# Patient Record
Sex: Male | Born: 1975 | Hispanic: Yes | Marital: Married | Smoking: Never smoker
Health system: Southern US, Community
[De-identification: ages and names within clinical notes are randomized; demographics above are authoritative.]

## PROBLEM LIST (undated history)

## (undated) DIAGNOSIS — E7800 Pure hypercholesterolemia, unspecified: Secondary | ICD-10-CM

## (undated) DIAGNOSIS — J45909 Unspecified asthma, uncomplicated: Secondary | ICD-10-CM

## (undated) DIAGNOSIS — T7840XA Allergy, unspecified, initial encounter: Secondary | ICD-10-CM

## (undated) HISTORY — DX: Unspecified asthma, uncomplicated: J45.909

## (undated) HISTORY — DX: Allergy, unspecified, initial encounter: T78.40XA

---

## 2018-04-29 DIAGNOSIS — J309 Allergic rhinitis, unspecified: Secondary | ICD-10-CM | POA: Diagnosis not present

## 2018-04-29 DIAGNOSIS — E663 Overweight: Secondary | ICD-10-CM | POA: Diagnosis not present

## 2018-04-29 DIAGNOSIS — E782 Mixed hyperlipidemia: Secondary | ICD-10-CM | POA: Diagnosis not present

## 2020-10-14 ENCOUNTER — Encounter (HOSPITAL_BASED_OUTPATIENT_CLINIC_OR_DEPARTMENT_OTHER): Payer: Self-pay | Admitting: Obstetrics and Gynecology

## 2020-10-14 ENCOUNTER — Emergency Department (HOSPITAL_BASED_OUTPATIENT_CLINIC_OR_DEPARTMENT_OTHER)
Admission: EM | Admit: 2020-10-14 | Discharge: 2020-10-14 | Disposition: A | Discharge status: Home / Self Care | Payer: Commercial Managed Care - PPO | Attending: Emergency Medicine | Admitting: Emergency Medicine

## 2020-10-14 ENCOUNTER — Emergency Department (HOSPITAL_BASED_OUTPATIENT_CLINIC_OR_DEPARTMENT_OTHER): Payer: Commercial Managed Care - PPO

## 2020-10-14 ENCOUNTER — Other Ambulatory Visit: Payer: Self-pay

## 2020-10-14 DIAGNOSIS — D72829 Elevated white blood cell count, unspecified: Secondary | ICD-10-CM | POA: Insufficient documentation

## 2020-10-14 DIAGNOSIS — K529 Noninfective gastroenteritis and colitis, unspecified: Secondary | ICD-10-CM | POA: Diagnosis not present

## 2020-10-14 DIAGNOSIS — R197 Diarrhea, unspecified: Secondary | ICD-10-CM | POA: Diagnosis present

## 2020-10-14 HISTORY — DX: Pure hypercholesterolemia, unspecified: E78.00

## 2020-10-14 LAB — CBC
HCT: 51.5 % (ref 39.0–52.0)
Hemoglobin: 17.4 g/dL — ABNORMAL HIGH (ref 13.0–17.0)
MCH: 29.3 pg (ref 26.0–34.0)
MCHC: 33.8 g/dL (ref 30.0–36.0)
MCV: 86.8 fL (ref 80.0–100.0)
Platelets: 268 10*3/uL (ref 150–400)
RBC: 5.93 MIL/uL — ABNORMAL HIGH (ref 4.22–5.81)
RDW: 12 % (ref 11.5–15.5)
WBC: 17.1 10*3/uL — ABNORMAL HIGH (ref 4.0–10.5)
nRBC: 0 % (ref 0.0–0.2)

## 2020-10-14 LAB — URINALYSIS, ROUTINE W REFLEX MICROSCOPIC
Bilirubin Urine: NEGATIVE
Glucose, UA: NEGATIVE mg/dL
Hgb urine dipstick: NEGATIVE
Ketones, ur: NEGATIVE mg/dL
Leukocytes,Ua: NEGATIVE
Nitrite: NEGATIVE
Protein, ur: 30 mg/dL — AB
Specific Gravity, Urine: 1.04 — ABNORMAL HIGH (ref 1.005–1.030)
pH: 6 (ref 5.0–8.0)

## 2020-10-14 LAB — LIPASE, BLOOD: Lipase: 11 U/L (ref 11–51)

## 2020-10-14 LAB — COMPREHENSIVE METABOLIC PANEL
ALT: 62 U/L — ABNORMAL HIGH (ref 0–44)
AST: 37 U/L (ref 15–41)
Albumin: 4.6 g/dL (ref 3.5–5.0)
Alkaline Phosphatase: 73 U/L (ref 38–126)
Anion gap: 13 (ref 5–15)
BUN: 30 mg/dL — ABNORMAL HIGH (ref 6–20)
CO2: 20 mmol/L — ABNORMAL LOW (ref 22–32)
Calcium: 9.4 mg/dL (ref 8.9–10.3)
Chloride: 106 mmol/L (ref 98–111)
Creatinine, Ser: 1.15 mg/dL (ref 0.61–1.24)
GFR, Estimated: >60 mL/min (ref >60)
Glucose, Bld: 127 mg/dL — ABNORMAL HIGH (ref 70–99)
Potassium: 3.7 mmol/L (ref 3.5–5.1)
Sodium: 139 mmol/L (ref 135–145)
Total Bilirubin: 0.8 mg/dL (ref 0.3–1.2)
Total Protein: 7.4 g/dL (ref 6.5–8.1)

## 2020-10-14 MED ORDER — LACTATED RINGERS IV BOLUS
1000.0000 mL | Freq: Once | INTRAVENOUS | Status: AC
  Administered 2020-10-14: 1000 mL via INTRAVENOUS

## 2020-10-14 MED ORDER — ONDANSETRON 4 MG PO TBDP
4.0000 mg | ORAL_TABLET | Freq: Once | ORAL | Status: AC | PRN
  Administered 2020-10-14: 4 mg via ORAL
  Filled 2020-10-14: qty 1

## 2020-10-14 MED ORDER — ONDANSETRON 4 MG PO TBDP: 4.0000 mg | ORAL_TABLET | Freq: Three times a day (TID) | ORAL | 0 refills | Status: DC | PRN

## 2020-10-14 MED ORDER — IOHEXOL 350 MG/ML SOLN
75.0000 mL | Freq: Once | INTRAVENOUS | Status: AC | PRN
  Administered 2020-10-14: 75 mL via INTRAVENOUS

## 2020-10-14 NOTE — ED Notes (Signed)
Pt discharged home after verbalizing understanding of discharge instructions; nad noted. 

## 2020-10-14 NOTE — Discharge Instructions (Signed)
You were evaluated in the Emergency Department and after careful evaluation, we did not find any emergent condition requiring admission or further testing in the hospital.  Your exam/testing today was overall reassuring. Symptoms are most consistent with gastroenteritis, which will run its course over the next few days. I will prescribe Zofran for nausea.  Please return to the Emergency Department if you experience any worsening of your condition.  Thank you for allowing [place] to be a part of your care.

## 2020-10-14 NOTE — ED Provider Notes (Signed)
[facility] EMERGENCY DEPT Provider Note   CSN: [id number] Arrival date & time: 10/14/20  1334     History Chief Complaint  Patient presents with   Diarrhea    Howard Alexander is a 45 y.o. male.  The history is provided by the patient.  Diarrhea Quality:  Watery Severity:  Moderate Duration:  3 days Timing:  Intermittent Progression:  Unchanged Associated symptoms: abdominal pain and vomiting   Associated symptoms: no chills, no fever, no headaches and no myalgias   Risk factors: no recent antibiotic use, no sick contacts, no suspicious food intake and no travel to endemic areas    45 year old male with a history of HLD presenting to the emergency department with a diarrheal illness.  The patient states that since Saturday, he has had anorexia, nausea, multiple episodes of watery diarrhea that are nonbloody.  Today he had 1 episode of NBNB emesis as well and has had no oral intake all day.  He denies any recent travel or suspect food intake.  He endorses mild bilateral cramping abdominal pain and discomfort, worse in the left lower quadrant.  He denies any fever or chills.  He denies any flank pain or burning while urinating or hematuria.  He denies any recent antibiotic use.  He states that he recently got over a COVID-19 infection this past month.  Past Medical History:  Diagnosis Date   Hypercholesterolemia     There are no problems to display for this patient.   History reviewed. No pertinent surgical history.     No family history on file.  Social History   Tobacco Use   Smoking status: Never    Passive exposure: Never   Smokeless tobacco: Never  Vaping Use   Vaping Use: Never used  Substance Use Topics   Alcohol use: Yes    Comment: Social   Drug use: Never    Home Medications Prior to Admission medications   Medication Sig Start Date End Date Taking? Authorizing Provider  ondansetron (ZOFRAN ODT) 4 MG disintegrating tablet Take 1 tablet  (4 mg total) by mouth every 8 (eight) hours as needed for nausea or vomiting. 10/14/20  Yes Ernie Avena, MD    Allergies    Patient has no allergy information on record.  Review of Systems   Review of Systems  Constitutional:  Negative for chills and fever.  Gastrointestinal:  Positive for abdominal pain, diarrhea, nausea and vomiting.  Musculoskeletal:  Negative for myalgias.  Neurological:  Negative for headaches.  All other systems reviewed and are negative.  Physical Exam Updated Vital Signs BP 113/85   Pulse 89   Temp 98.7 F (37.1 C) (Oral)   Resp 16   Ht 5\' 10"  (1.778 m)   Wt 112.4 kg   SpO2 98%   BMI 35.57 kg/m   Physical Exam Vitals and nursing note reviewed.  Constitutional:      General: He is not in acute distress. HENT:     Head: Normocephalic and atraumatic.     Mouth/Throat:     Mouth: Mucous membranes are dry.  Eyes:     Conjunctiva/sclera: Conjunctivae normal.     Pupils: Pupils are equal, round, and reactive to light.  Cardiovascular:     Rate and Rhythm: Normal rate and regular rhythm.  Pulmonary:     Effort: Pulmonary effort is normal. No respiratory distress.  Abdominal:     General: There is no distension.     Tenderness: There is abdominal tenderness in the  right lower quadrant and left lower quadrant. There is no guarding or rebound.  Musculoskeletal:        General: No deformity or signs of injury.     Cervical back: Normal range of motion and neck supple.  Skin:    Findings: No lesion or rash.  Neurological:     General: No focal deficit present.     Mental Status: He is alert. Mental status is at baseline.    ED Results / Procedures / Treatments   Labs (all labs ordered are listed, but only abnormal results are displayed) Labs Reviewed  COMPREHENSIVE METABOLIC PANEL - Abnormal; Notable for the following components:      Result Value   CO2 20 (*)    Glucose, Bld 127 (*)    BUN 30 (*)    ALT 62 (*)    All other components  within normal limits  CBC - Abnormal; Notable for the following components:   WBC 17.1 (*)    RBC 5.93 (*)    Hemoglobin 17.4 (*)    All other components within normal limits  URINALYSIS, ROUTINE W REFLEX MICROSCOPIC - Abnormal; Notable for the following components:   APPearance HAZY (*)    Specific Gravity, Urine 1.040 (*)    Protein, ur 30 (*)    All other components within normal limits  GASTROINTESTINAL PANEL BY PCR, STOOL (REPLACES STOOL CULTURE)  LIPASE, BLOOD    EKG None  Radiology CT ABDOMEN PELVIS W CONTRAST  Result Date: 10/14/2020 CLINICAL DATA:  Diarrhea and vomiting since Saturday. EXAM: CT ABDOMEN AND PELVIS WITH CONTRAST TECHNIQUE: Multidetector CT imaging of the abdomen and pelvis was performed using the standard protocol following bolus administration of intravenous contrast. CONTRAST:  50mL OMNIPAQUE IOHEXOL 350 MG/ML SOLN COMPARISON:  None. FINDINGS: Lower chest: No acute abnormality. Hepatobiliary: No focal liver abnormality is seen. No gallstones, gallbladder wall thickening, or biliary dilatation. Pancreas: Unremarkable. No pancreatic ductal dilatation or surrounding inflammatory changes. Spleen: Normal in size without focal abnormality. Adrenals/Urinary Tract: Adrenal glands are unremarkable. Punctate calculus in the lower pole of the left kidney. No hydronephrosis. The bladder is unremarkable for the degree of distention. Stomach/Bowel: Stomach is within normal limits. Appendix appears normal. No evidence of bowel wall thickening, distention, or inflammatory changes. Vascular/Lymphatic: No significant vascular findings are present. No enlarged abdominal or pelvic lymph nodes. Reproductive: Prostate is unremarkable. Other: Tiny fat containing umbilical hernia. No abdominopelvic ascites. No pneumoperitoneum. Musculoskeletal: No acute or significant osseous findings. IMPRESSION: 1. No acute intra-abdominal process. 2. Punctate nonobstructive left nephrolithiasis.  Electronically Signed   By: Obie Dredge M.D.   On: 10/14/2020 18:08    Procedures Procedures   Medications Ordered in ED Medications  ondansetron (ZOFRAN-ODT) disintegrating tablet 4 mg (4 mg Oral Given 10/14/20 1412)  lactated ringers bolus 1,000 mL (1,000 mLs Intravenous New Bag/Given 10/14/20 1708)  iohexol (OMNIPAQUE) 350 MG/ML injection 75 mL (75 mLs Intravenous Contrast Given 10/14/20 1739)    ED Course  I have reviewed the triage vital signs and the nursing notes.  Pertinent labs & imaging results that were available during my care of the patient were reviewed by me and considered in my medical decision making (see chart for details).    MDM Rules/Calculators/A&P                           45 year old male with a history of HLD presenting to the emergency department with a diarrheal illness.  Patient endorses roughly 3 days of nonbloody watery diarrhea with associated nausea and one episode of NB emesis earlier today.  Patient has had decreased oral intake over that same time and feels dehydrated.  On arrival, he was afebrile, hemodynamically stable, with reassuring vitals.  Physical exam significant for bilateral lower abdominal tenderness to palpation without rebound or guarding.  Some dry mucous membranes noted.  Differential diagnosis includes gastroenteritis, diverticulitis, less likely appendicitis, urinary tract infection/pyelonephritis, nephrolithiasis.  Given the patient's tenderness to palpation on exam, will obtain CT abdomen pelvis to evaluate for possible diverticulitis.  Symptoms do sound consistent with likely gastroenteritis.  An IV fluid bolus was ordered for volume resuscitation in the setting of likely dehydration due to poor oral intake and dry mucous membranes on exam.  Work-up initiated to include the following labs: CBC with a nonspecifically elevated leukocytosis to 17.1, hemoglobin of 17.4, favored possible hemoconcentration in the setting of dehydration, BUN  mildly elevated to 30, creatinine 1.15, no significant electrolyte abnormality, ALT mildly elevated to 62, urinalysis without evidence of urinary tract infection.  CT of the abdomen pelvis revealed no acute abnormalities in the abdomen or pelvis.  No evidence of appendicitis or diverticulitis.  Punctate nephrolithiasis was noted which does not correlate with the patient's symptoms as the etiology of his presentation today.  His symptoms are most consistent with gastroenteritis.  He was resuscitated with an IV fluid bolus and provided on a Zofran tablet for nausea.  Overall feel that the patient is stable for discharge with continued outpatient management.  Prescription for Zofran was provided.  The patient was advised to follow-up with his PCP as needed.  Final Clinical Impression(s) / ED Diagnoses Final diagnoses:  Gastroenteritis    Rx / DC Orders ED Discharge Orders          Ordered    ondansetron (ZOFRAN ODT) 4 MG disintegrating tablet  Every 8 hours PRN        10/14/20 1816             Ernie Avena, MD 10/14/20 1825

## 2020-10-14 NOTE — ED Triage Notes (Signed)
Patient reports diarrhea and emesis ongoing since Saturday. Patient reports he has not been able to hold much of anything down fluid wise. Patient reports every time he drinks something his stomach feels "gurgly"

## 2020-10-14 NOTE — ED Notes (Signed)
Pt from home with diarrhea since Saturday. Pt believes he had food poisoning. Pt states it has slowed down. Endorses nausea, had one episode of vomiting today at 1300. Pt has had mostly fluids and a few crackers since Saturday. Last episode of diarrhea was 8AM today. Pt alert & oriented, nad noted.

## 2020-11-05 ENCOUNTER — Ambulatory Visit (INDEPENDENT_AMBULATORY_CARE_PROVIDER_SITE_OTHER): Payer: Commercial Managed Care - PPO | Admitting: Cardiology

## 2020-11-05 ENCOUNTER — Encounter (HOSPITAL_BASED_OUTPATIENT_CLINIC_OR_DEPARTMENT_OTHER): Payer: Self-pay | Admitting: Cardiology

## 2020-11-05 VITALS — BP 127/90 | HR 69 | Ht 69.0 in | Wt 236.0 lb

## 2020-11-05 DIAGNOSIS — Z7189 Other specified counseling: Secondary | ICD-10-CM

## 2020-11-05 DIAGNOSIS — E8881 Metabolic syndrome: Secondary | ICD-10-CM

## 2020-11-05 DIAGNOSIS — E669 Obesity, unspecified: Secondary | ICD-10-CM | POA: Diagnosis not present

## 2020-11-05 DIAGNOSIS — E785 Hyperlipidemia, unspecified: Secondary | ICD-10-CM

## 2020-11-05 NOTE — Patient Instructions (Signed)

## 2020-11-05 NOTE — Progress Notes (Signed)
Cardiology Office Note:    Date:  11/05/2020   ID:  Howard Alexander, DOB [birth year], MRN [id number]  PCP:  Lewis Moccasin, MD  Cardiologist:  Jodelle Red, MD  Referring MD: Lewis Moccasin, MD   CC: new patient consultation   History of Present Illness:    Howard Alexander is a 45 y.o. male with a hx of hyperlipidemia, who is seen as a new consult at the request of Lewis Moccasin, MD for the evaluation and management of cardiovascular risk factors and preventative care.  Records reviewed from appt with Dr. Duanne Guess dated 10/23/20. Seen for abdominal pain and diarrhea.   Referral was sent for evaluation and treatment. Patient called Dr. Chauncy Passy office and confirmed that he was referred to cardiology. He does not have any current cardiovascular complaints/concerns, but we reviewed his CV history and risk.  Cardiovascular risk factors: Prior clinical ASCVD: None Comorbid conditions: hyperlipidemia (10+ years), borderline pre-diabetic at one point (has dropped weight since then)  Metabolic syndrome/Obesity: +Metabolic syndrome (high HDL, low LDL, high TG, fatty liver, abnormal glucose metabolism, abdominal adiposity), Highest adult weight about 248 lbs, BMI 34.85 Chronic inflammatory conditions: None Tobacco use history: Never Family history: Father and brother have hypertension. Father is also diabetic. Prior cardiac testing and/or incidental findings on other testing (ie coronary calcium): no calcifications noted on CT abdomen/pelvis Exercise level: Mainly walking, 3-4 times a week for about an hour.  Current diet: Orders out frequently, tries to avoid french fries.   Today, he reports having medically controlled hyperlipidemia for 10+ years.  He was seen in the ED 10/14/2020 with persistent diarrheal symptoms. Just prior to his GI issues he was recovering from a Covid infection. Denies lingering chest pains or shortness of breath.  Had prior sleep studies, and was told  at one time to have mild sleep apnea. Subsequent sleep study suggested his mild sleep apnea to be insignificant.  He denies any palpitations, chest pain, or shortness of breath. No lightheadedness, headaches, syncope, orthopnea, or PND. Also has no lower extremity edema or exertional symptoms.   Past Medical History:  Diagnosis Date   Hypercholesterolemia     History reviewed. No pertinent surgical history.  Current Medications: Current Outpatient Medications on File Prior to Visit  Medication Sig   albuterol (VENTOLIN HFA) 108 (90 Base) MCG/ACT inhaler SMARTSIG:1 Puff(s) Via Inhaler Every 4-6 Hours PRN   atorvastatin (LIPITOR) 20 MG tablet    levothyroxine (SYNTHROID) 175 MCG tablet    loperamide (IMODIUM) 2 MG capsule PLEASE SEE ATTACHED FOR DETAILED DIRECTIONS   montelukast (SINGULAIR) 10 MG tablet SMARTSIG:1 Tablet(s) By Mouth Every Evening   ondansetron (ZOFRAN ODT) 4 MG disintegrating tablet Take 1 tablet (4 mg total) by mouth every 8 (eight) hours as needed for nausea or vomiting.   No current facility-administered medications on file prior to visit.     Allergies:   Patient has no allergy information on record.   Social History   Tobacco Use   Smoking status: Never    Passive exposure: Never   Smokeless tobacco: Never  Vaping Use   Vaping Use: Never used  Substance Use Topics   Alcohol use: Yes    Comment: Social   Drug use: Never    Family History: Father and brother have hypertension. Father is also diabetic.  ROS:   Please see the history of present illness.  Additional pertinent ROS: Constitutional: Negative for chills, fever, night sweats, unintentional weight loss  HENT: Negative for ear pain  and hearing loss.   Eyes: Negative for loss of vision and eye pain.  Respiratory: Negative for cough, sputum, wheezing.   Cardiovascular: See HPI. Gastrointestinal: Negative for melena, and hematochezia. Positive for abdominal pain and diarrhea, which he feels is  improving Genitourinary: Negative for dysuria and hematuria.  Musculoskeletal: Negative for falls and myalgias.  Skin: Negative for itching and rash.  Neurological: Negative for focal weakness, focal sensory changes and loss of consciousness.  Endo/Heme/Allergies: Does not bruise/bleed easily.     EKGs/Labs/Other Studies Reviewed:    The following studies were reviewed today: No prior cardiovascular studies available.  EKG:  EKG is personally reviewed.   11/05/2020: NSR at 69 bpm  Recent Labs: 10/14/2020: ALT 62; BUN 30; Creatinine, Ser 1.15; Hemoglobin 17.4; Platelets 268; Potassium 3.7; Sodium 139   Recent Lipid Panel No results found for: CHOL, TRIG, HDL, CHOLHDL, VLDL, LDLCALC, LDLDIRECT  Physical Exam:    VS:  BP 127/90   Pulse 69   Ht 5\' 9"  (1.753 m)   Wt 236 lb (107 kg)   SpO2 100%   BMI 34.85 kg/m     Wt Readings from Last 3 Encounters:  11/05/20 236 lb (107 kg)  10/14/20 247 lb 14.4 oz (112.4 kg)    GEN: Well nourished, well developed in no acute distress HEENT: Normal, moist mucous membranes NECK: No JVD CARDIAC: regular rhythm, normal S1 and S2, no rubs or gallops. No murmur. VASCULAR: Radial and DP pulses 2+ bilaterally. No carotid bruits RESPIRATORY:  Clear to auscultation without rales, wheezing or rhonchi  ABDOMEN: Soft, non-tender, non-distended MUSCULOSKELETAL:  Ambulates independently SKIN: Warm and dry, no edema NEUROLOGIC:  Alert and oriented x 3. No focal neuro deficits noted. PSYCHIATRIC:  Normal affect    ASSESSMENT:    1. Metabolic syndrome   2. Dyslipidemia with high LDL and low HDL   3. Cardiac risk counseling   4. Counseling on health promotion and disease prevention   5. Obesity (BMI 30.0-34.9)    PLAN:    Metabolic syndrome Dyslipidemia with low HDL, high LDL, elevated TG Abdominal adiposity Obesity, BMI 34 -has been on atorvastatin, last labs per [facility] 10/31/19 show Tchol 136, HDL 35, LDL 81, TG 110  -has been told he has been  pre-diabetic in the past, but no A1c available to me -interested in weight loss, but would hold on this until his GI symptoms improve  Cardiac risk counseling and prevention recommendations: -recommend heart healthy/Mediterranean diet, with whole grains, fruits, vegetable, fish, lean meats, nuts, and olive oil. Limit salt. -recommend moderate walking, 3-5 times/week for 30-50 minutes each session. Aim for at least 150 minutes.week. Goal should be pace of 3 miles/hours, or walking 1.5 miles in 30 minutes -recommend avoidance of tobacco products. Avoid excess alcohol.  Plan for follow up: I would be happy to see him back as needed  12/31/19, MD, PhD, [facility]  [facility]    Medication Adjustments/Labs and Tests Ordered: Current medicines are reviewed at length with the patient today.  Concerns regarding medicines are outlined above.   Orders Placed This Encounter  Procedures   EKG 12-Lead    No orders of the defined types were placed in this encounter.  Patient Instructions  Medication Instructions:  Your Physician recommend you continue on your current medication as directed.    *If you need a refill on your cardiac medications before your next appointment, please call your pharmacy*   Lab Work: None ordered today   Testing/Procedures:  None ordered today   Follow-Up: At [facility], you and your health needs are our priority.  As part of our continuing mission to provide you with exceptional heart care, we have created designated Provider Care Teams.  These Care Teams include your primary Cardiologist (physician) and Advanced Practice Providers (APPs -  Physician Assistants and Nurse Practitioners) who all work together to provide you with the care you need, when you need it.  We recommend signing up for the patient portal called "MyChart".  Sign up information is provided on this After Visit Summary.  MyChart is used to connect with patients for  Virtual Visits (Telemedicine).  Patients are able to view lab/test results, encounter notes, upcoming appointments, etc.  Non-urgent messages can be sent to your provider as well.   To learn more about what you can do with MyChart, go to [email or web address].    Your next appointment:   As needed   The format for your next appointment:   In Person  Provider:   Jodelle Red, MD     [facility],acting as a scribe for Jodelle Red, MD.,have documented all relevant documentation on the behalf of Jodelle Red, MD,as directed by  Jodelle Red, MD while in the presence of Jodelle Red, MD.  I, Jodelle Red, MD, have reviewed all documentation for this visit. The documentation on 11/05/20 for the exam, diagnosis, procedures, and orders are all accurate and complete.   Signed, Jodelle Red, MD PhD 11/05/2020 5:59 PM    [facility]

## 2020-11-06 ENCOUNTER — Other Ambulatory Visit (HOSPITAL_BASED_OUTPATIENT_CLINIC_OR_DEPARTMENT_OTHER): Payer: Self-pay | Admitting: Family Medicine

## 2020-11-06 ENCOUNTER — Other Ambulatory Visit: Payer: Self-pay

## 2020-11-06 ENCOUNTER — Ambulatory Visit (HOSPITAL_BASED_OUTPATIENT_CLINIC_OR_DEPARTMENT_OTHER)
Admission: RE | Admit: 2020-11-06 | Discharge: 2020-11-06 | Disposition: A | Discharge status: Home / Self Care | Payer: Commercial Managed Care - PPO | Source: Ambulatory Visit | Attending: Family Medicine | Admitting: Family Medicine

## 2020-11-06 DIAGNOSIS — R634 Abnormal weight loss: Secondary | ICD-10-CM

## 2020-11-06 DIAGNOSIS — R109 Unspecified abdominal pain: Secondary | ICD-10-CM | POA: Diagnosis not present

## 2021-03-31 ENCOUNTER — Encounter (HOSPITAL_BASED_OUTPATIENT_CLINIC_OR_DEPARTMENT_OTHER): Payer: Self-pay | Admitting: Nurse Practitioner

## 2021-03-31 ENCOUNTER — Ambulatory Visit (INDEPENDENT_AMBULATORY_CARE_PROVIDER_SITE_OTHER): Payer: Commercial Managed Care - PPO | Admitting: Nurse Practitioner

## 2021-03-31 ENCOUNTER — Other Ambulatory Visit: Payer: Self-pay

## 2021-03-31 VITALS — BP 128/88 | HR 96 | Ht 69.0 in | Wt 238.0 lb

## 2021-03-31 DIAGNOSIS — Z1321 Encounter for screening for nutritional disorder: Secondary | ICD-10-CM

## 2021-03-31 DIAGNOSIS — Z130 Encounter for screening for diseases of the blood and blood-forming organs and certain disorders involving the immune mechanism: Secondary | ICD-10-CM

## 2021-03-31 DIAGNOSIS — E782 Mixed hyperlipidemia: Secondary | ICD-10-CM | POA: Diagnosis not present

## 2021-03-31 DIAGNOSIS — Z1329 Encounter for screening for other suspected endocrine disorder: Secondary | ICD-10-CM

## 2021-03-31 DIAGNOSIS — E038 Other specified hypothyroidism: Secondary | ICD-10-CM

## 2021-03-31 DIAGNOSIS — R5383 Other fatigue: Secondary | ICD-10-CM | POA: Insufficient documentation

## 2021-03-31 DIAGNOSIS — Z833 Family history of diabetes mellitus: Secondary | ICD-10-CM | POA: Diagnosis not present

## 2021-03-31 DIAGNOSIS — Z13228 Encounter for screening for other metabolic disorders: Secondary | ICD-10-CM

## 2021-03-31 DIAGNOSIS — Z0000 Encounter for general adult medical examination without abnormal findings: Secondary | ICD-10-CM

## 2021-03-31 DIAGNOSIS — E063 Autoimmune thyroiditis: Secondary | ICD-10-CM

## 2021-03-31 DIAGNOSIS — J4520 Mild intermittent asthma, uncomplicated: Secondary | ICD-10-CM | POA: Insufficient documentation

## 2021-03-31 HISTORY — DX: Other specified hypothyroidism: E03.8

## 2021-03-31 HISTORY — DX: Autoimmune thyroiditis: E06.3

## 2021-03-31 HISTORY — DX: Family history of diabetes mellitus: Z83.3

## 2021-03-31 MED ORDER — MONTELUKAST SODIUM 10 MG PO TABS: ORAL_TABLET | ORAL | 3 refills | Status: DC

## 2021-03-31 NOTE — Assessment & Plan Note (Signed)
Moderately increased fatigue in the setting of history of hypothyroidism.  He has been on his current dosage for several months now without a recheck.  We will obtain labs today for further evaluation and make recommendations to the changes in plan of care as necessary based on findings. ?

## 2021-03-31 NOTE — Patient Instructions (Addendum)
Thank you for choosing [facility] for your Primary Care needs. I am excited for the opportunity to partner with you to meet your health care goals. It was a pleasure meeting you today!  Recommendations from today's visit: We will get your labs today and make sure everything looks OK.  If your thyroid levels are normal we will plan to continue on the same dosage- if not we can make changes and I will let you know.  We will also check your vitamin D levels to make sure that these aren't contributing to your fatigue.   Information on diet, exercise, and health maintenance recommendations are listed below. This is information to help you be sure you are on track for optimal health and monitoring.   Please look over this and let us know if you have any questions or if you have completed any of the health maintenance outside of [place] so that we can be sure your records are up to date.  ___________________________________________________________ About Me: I am an Adult-Geriatric Nurse Practitioner with a background in caring for patients for more than 20 years with a strong intensive care background. I provide primary care and sports medicine services to patients age 34 and older within this office. My education had a strong focus on caring for the older adult population, which I am passionate about. I am also the director of the APP Fellowship with [facility].   My desire is to provide you with the best service through preventive medicine and supportive care. I consider you a part of the medical team and value your input. I work diligently to ensure that you are heard and your needs are met in a safe and effective manner. I want you to feel comfortable with me as your provider and want you to know that your health concerns are important to me.  For your information, our office hours are: Monday, Tuesday, and Thursday 8:00 AM - 5:00 PM Wednesday and Friday 8:00  AM - 12:00 PM.   In my time away from the office I am teaching new APP's within the system and am unavailable, but my partner, Dr. Burnard Bunting is in the office for emergent needs.   If you have questions or concerns, please call our office at [phone] or send [place] a MyChart message and we will respond as quickly as possible.  ____________________________________________________________ MyChart:  For all urgent or time sensitive needs we ask that you please call the office to avoid delays. Our number is [phone]. MyChart is not constantly monitored and due to the large volume of messages a day, replies may take up to 72 business hours.  MyChart Policy: MyChart allows for you to see your visit notes, after visit summary, provider recommendations, lab and tests results, make an appointment, request refills, and contact your provider or the office for non-urgent questions or concerns. Providers are seeing patients during normal business hours and do not have built in time to review MyChart messages.  We ask that you allow a minimum of 3 business days for responses to [employer]. For this reason, please do not send urgent requests through [place]. Please call the office at [phone]. New and ongoing conditions may require a visit. We have virtual and in person visit available for your convenience.  Complex MyChart concerns may require a visit. Your provider may request you schedule a virtual or in person visit to ensure we are providing the best care possible. MyChart messages  sent after 11:00 AM on Friday will not be received by the provider until Monday morning.    Lab and Test Results: You will receive your lab and test results on MyChart as soon as they are completed and results have been sent by the lab or testing facility. Due to this service, you will receive your results BEFORE your provider.  I review lab and tests results each morning prior to seeing patients. Some results require  collaboration with other providers to ensure you are receiving the most appropriate care. For this reason, we ask that you please allow a minimum of 3-5 business days from the time the ALL results have been received for your provider to receive and review lab and test results and contact you about these.  Most lab and test result comments from the provider will be sent through [place]. Your provider may recommend changes to the plan of care, follow-up visits, repeat testing, ask questions, or request an office visit to discuss these results. You may reply directly to this message or call the office at [phone] to provide information for the provider or set up an appointment. In some instances, you will be called with test results and recommendations. Please let us know if this is preferred and we will make note of this in your chart to provide this for you.    If you have not heard a response to your lab or test results in 5 business days from all results returning to [place], please call the office to let us know. We ask that you please avoid calling prior to this time unless there is an emergent concern. Due to high call volumes, this can delay the resulting process.  After Hours: For all non-emergency after hours needs, please call the office at [phone] and select the option to reach the on-call provider service. On-call services are shared between multiple [facility] offices and therefore it will not be possible to speak directly with your provider. On-call providers may provide medical advice and recommendations, but are unable to provide refills for maintenance medications.  For all emergency or urgent medical needs after normal business hours, we recommend that you seek care at the closest Urgent Care or Emergency Department to ensure appropriate treatment in a timely manner.  [facility] [place] has a 24 hour emergency room located on the ground floor for your convenience.    Urgent Concerns During the Business Day Providers are seeing patients from 8AM to [place] with a busy schedule and are most often not able to respond to non-urgent calls until the end of the day or the next business day. If you should have URGENT concerns during the day, please call and speak to the nurse or schedule a same day appointment so that we can address your concern without delay.   Thank you, again, for choosing me as your health care partner. I appreciate your trust and look forward to learning more about you.   [name], DNP, AGNP-c ___________________________________________________________  Health Maintenance Recommendations Screening Testing Mammogram Every 1 -2 years based on history and risk factors Starting at age 10 Pap Smear Ages 21-39 every 3 years Ages 4-65 every 5 years with HPV testing More frequent testing may be required based on results and history Colon Cancer Screening Every 1-10 years based on test performed, risk factors, and history Starting at age 70 Bone Density Screening Every 2-10 years based on history Starting at age 49 for women Recommendations for men differ  based on medication usage, history, and risk factors AAA Screening One time ultrasound Men 47-73 years old who have every smoked Lung Cancer Screening Low Dose Lung CT every 12 months Age 54-80 years with a 30 pack-year smoking history who still smoke or who have quit within the last 15 years  Screening Labs Routine  Labs: Complete Blood Count (CBC), Complete Metabolic Panel (CMP), Cholesterol (Lipid Panel) Every 6-12 months based on history and medications May be recommended more frequently based on current conditions or previous results Hemoglobin A1c Lab Every 3-12 months based on history and previous results Starting at age 17 or earlier with diagnosis of diabetes, high cholesterol, BMI >26, and/or risk factors Frequent monitoring for patients with diabetes to ensure blood  sugar control Thyroid Panel (TSH w/ T3 & T4) Every 6 months based on history, symptoms, and risk factors May be repeated more often if on medication HIV One time testing for all patients 58 and older May be repeated more frequently for patients with increased risk factors or exposure Hepatitis C One time testing for all patients 14 and older May be repeated more frequently for patients with increased risk factors or exposure Gonorrhea, Chlamydia Every 12 months for all sexually active persons 13-24 years Additional monitoring may be recommended for those who are considered high risk or who have symptoms PSA Men 17-26 years old with risk factors Additional screening may be recommended from age 31-69 based on risk factors, symptoms, and history  Vaccine Recommendations Tetanus Booster All adults every 10 years Flu Vaccine All patients 6 months and older every year COVID Vaccine All patients 12 years and older Initial dosing with booster May recommend additional booster based on age and health history HPV Vaccine 2 doses all patients age 53-26 Dosing may be considered for patients over 26 Shingles Vaccine (Shingrix) 2 doses all adults 27 years and older Pneumonia (Pneumovax 23) All adults 46 years and older May recommend earlier dosing based on health history Pneumonia (Prevnar 34) All adults 1 years and older Dosed 1 year after Pneumovax 23  Additional Screening, Testing, and Vaccinations may be recommended on an individualized basis based on family history, health history, risk factors, and/or exposure.  __________________________________________________________  Diet Recommendations for All Patients  I recommend that all patients maintain a diet low in saturated fats, carbohydrates, and cholesterol. While this can be challenging at first, it is not impossible and small changes can make big differences.  Things to try: Decreasing the amount of soda, sweet tea, and/or juice  to one or less per day and replace with water While water is always the first choice, if you do not like water you may consider adding a water additive without sugar to improve the taste other sugar free drinks Replace potatoes with a brightly colored vegetable at dinner Use healthy oils, such as canola oil or olive oil, instead of butter or hard margarine Limit your bread intake to two pieces or less a day Replace regular pasta with low carb pasta options Bake, broil, or grill foods instead of frying Monitor portion sizes  Eat smaller, more frequent meals throughout the day instead of large meals  An important thing to remember is, if you love foods that are not great for your health, you don't have to give them up completely. Instead, allow these foods to be a reward when you have done well. Allowing yourself to still have special treats every once in a while is a nice way to tell yourself thank  you for working hard to keep yourself healthy.   Also remember that every day is a new day. If you have a bad day and "fall off the wagon", you can still climb right back up and keep moving along on your journey!  We have resources available to help you!  Some websites that may be helpful include: www.http://carter.biz/  Www.VeryWellFit.com _____________________________________________________________  Activity Recommendations for All Patients  I recommend that all adults get at least 20 minutes of moderate physical activity that elevates your heart rate at least 5 days out of the week.  Some examples include: Walking or jogging at a pace that allows you to carry on a conversation Cycling (stationary bike or outdoors) Water aerobics Yoga Weight lifting Dancing If physical limitations prevent you from putting stress on your joints, exercise in a pool or seated in a chair are excellent options.  Do determine your MAXIMUM heart rate for activity: YOUR AGE - 220 = MAX HeartRate   Remember! Do not  push yourself too hard.  Start slowly and build up your pace, speed, weight, time in exercise, etc.  Allow your body to rest between exercise and get good sleep. You will need more water than normal when you are exerting yourself. Do not wait until you are thirsty to drink. Drink with a purpose of getting in at least 8, 8 ounce glasses of water a day plus more depending on how much you exercise and sweat.    If you begin to develop dizziness, chest pain, abdominal pain, jaw pain, shortness of breath, headache, vision changes, lightheadedness, or other concerning symptoms, stop the activity and allow your body to rest. If your symptoms are severe, seek emergency evaluation immediately. If your symptoms are concerning, but not severe, please let us know so that we can recommend further evaluation.

## 2021-03-31 NOTE — Assessment & Plan Note (Signed)
We will monitor labs today for evaluation of current control.  He is experiencing some increased fatigue which could be related to his thyroid.  ?We will hold off on making changes to medication worsening refills until lab results have returned to ensure appropriate dosing.  ?

## 2021-03-31 NOTE — Assessment & Plan Note (Signed)
Chronic.  Reportedly well controlled. ?We will obtain labs today. ?

## 2021-03-31 NOTE — Assessment & Plan Note (Signed)
Chronic.  Well-controlled.  No alarm symptoms present today. ?Refill on montelukast. ?

## 2021-03-31 NOTE — Assessment & Plan Note (Signed)
We will obtain labs today. ?

## 2021-03-31 NOTE — Progress Notes (Signed)
Tollie Eth, DNP, AGNP-c Primary Care & Sports Medicine [address]   [address] [place], [place] [address] [phone]  New patient visit   Patient: Howard Alexander   DOB: 12-Sep-1975   46 y.o. Male  MRN: [id number] Visit Date: 03/31/2021  Patient Care Team: Jerrett Baldinger, Sung Amabile, NP as PCP - General (Nurse Practitioner) Jodelle Red, MD as PCP - Cardiology (Cardiology)  Today's healthcare provider: Tollie Eth, NP   Chief Complaint  Patient presents with   New Patient (Initial Visit)    Patient presents today to establish care. Patient would like full lab panel. He is mostly concerned with his Tyroid. He needs refills today on Singular and Levothyroxine.    Subjective    Howard Alexander is a 46 y.o. male who presents today as a new patient to establish care.    Patient endorses the following concerns presently: Hypothyroid Long standing history of hypothyroidism. He is currently on levothyroxine for management. He reports that overall he feels well, but he does have increased fatigue. He is not sure if this is related to his thyroid or something else.  He denies any concerning symptoms such as chest pain, palpitations, vision changes, weakness.  He reports that he is sleeping fairly well.  His levothyroxine was changed at the end of last year and he has not had the levels rechecked and would like to have that done today.  He also has a history of allergies for which he takes montelukast.  These are well controlled with the current medication regimen.  He has no concerning symptoms today.  He does tell me he has a family history of diabetes.  He would like testing for this today.  History reviewed and reveals the following: Past Medical History:  Diagnosis Date   Allergy    Seasonal/pollen   Asthma    Hypercholesterolemia    Hypothyroidism due to Hashimoto's thyroiditis 03/31/2021   History reviewed. No pertinent surgical history. Family Status   Relation Name Status   Father Kaid Spohr (Not Specified)   Son Laurette Schimke (Not Specified)   Family History  Problem Relation Age of Onset   Cancer Father    ADD / ADHD Son    Social History   Socioeconomic History   Marital status: Married    Spouse name: Not on file   Number of children: Not on file   Years of education: Not on file   Highest education level: Not on file  Occupational History   Not on file  Tobacco Use   Smoking status: Never    Passive exposure: Never   Smokeless tobacco: Never   Tobacco comments:    Never started  Vaping Use   Vaping Use: Never used  Substance and Sexual Activity   Alcohol use: Not Currently    Alcohol/week: 1.0 standard drink    Types: 1 Standard drinks or equivalent per week    Comment: Social only   Drug use: Never   Sexual activity: Yes    Birth control/protection: Surgical    Comment: Vasectomy  Other Topics Concern   Not on file  Social History Narrative   Not on file   Social Determinants of Health   Financial Resource Strain: Not on file  Food Insecurity: Not on file  Transportation Needs: Not on file  Physical Activity: Not on file  Stress: Not on file  Social Connections: Not on file   Outpatient Medications Prior to Visit  Medication Sig  albuterol (VENTOLIN HFA) 108 (90 Base) MCG/ACT inhaler SMARTSIG:1 Puff(s) Via Inhaler Every 4-6 Hours PRN   atorvastatin (LIPITOR) 20 MG tablet    levothyroxine (SYNTHROID) 175 MCG tablet    loperamide (IMODIUM) 2 MG capsule PLEASE SEE ATTACHED FOR DETAILED DIRECTIONS   [DISCONTINUED] montelukast (SINGULAIR) 10 MG tablet SMARTSIG:1 Tablet(s) By Mouth Every Evening   [DISCONTINUED] ondansetron (ZOFRAN ODT) 4 MG disintegrating tablet Take 1 tablet (4 mg total) by mouth every 8 (eight) hours as needed for nausea or vomiting.   No facility-administered medications prior to visit.   Not on File  There is no immunization history on file for this  patient.  Health Maintenance Due: Health Maintenance  Topic Date Due   COVID-19 Vaccine (1) Never done   HIV Screening  Never done   Hepatitis C Screening  Never done   TETANUS/TDAP  Never done   COLONOSCOPY (Pts 45-10yrs Insurance coverage will need to be confirmed)  Never done   INFLUENZA VACCINE  04/25/2021 (Originally 08/26/2020)   HPV VACCINES  Aged Out    Review of Systems All review of systems negative except what is listed in the HPI   Objective    BP 128/88    Pulse 96    Ht 5\' 9"  (1.753 m)    Wt 238 lb (108 kg)    SpO2 96%    BMI 35.15 kg/m  Physical Exam Vitals and nursing note reviewed.  Constitutional:      Appearance: Normal appearance.  HENT:     Head: Normocephalic.  Eyes:     Extraocular Movements: Extraocular movements intact.     Conjunctiva/sclera: Conjunctivae normal.     Pupils: Pupils are equal, round, and reactive to light.  Neck:     Vascular: No carotid bruit.  Cardiovascular:     Rate and Rhythm: Normal rate and regular rhythm.     Pulses: Normal pulses.     Heart sounds: Normal heart sounds. No murmur heard. Pulmonary:     Effort: Pulmonary effort is normal.     Breath sounds: Normal breath sounds. No wheezing.  Abdominal:     General: Abdomen is flat. Bowel sounds are normal. There is no distension.     Palpations: Abdomen is soft.     Tenderness: There is no abdominal tenderness. There is no guarding.  Musculoskeletal:        General: Normal range of motion.     Cervical back: Normal range of motion and neck supple.     Right lower leg: No edema.     Left lower leg: No edema.  Lymphadenopathy:     Cervical: No cervical adenopathy.  Skin:    General: Skin is warm and dry.     Capillary Refill: Capillary refill takes less than 2 seconds.  Neurological:     General: No focal deficit present.     Mental Status: He is alert and oriented to person, place, and time.     Motor: No weakness.  Psychiatric:        Mood and Affect: Mood  normal.        Behavior: Behavior normal.        Thought Content: Thought content normal.        Judgment: Judgment normal.    No results found for any visits on 03/31/21.  Assessment & Plan      Problem List Items Addressed This Visit     Hypothyroidism due to Hashimoto's thyroiditis - Primary    We  will monitor labs today for evaluation of current control.  He is experiencing some increased fatigue which could be related to his thyroid.  We will hold off on making changes to medication worsening refills until lab results have returned to ensure appropriate dosing.       Relevant Orders   CBC with Differential/Platelet   Comprehensive metabolic panel   TSH   T4   T3   VITAMIN D 25 Hydroxy (Vit-D Deficiency, Fractures)   Hemoglobin A1c   Family history of diabetes mellitus    We will obtain labs today.      Relevant Orders   CBC with Differential/Platelet   Comprehensive metabolic panel   TSH   T4   T3   VITAMIN D 25 Hydroxy (Vit-D Deficiency, Fractures)   Hemoglobin A1c   LDL cholesterol, direct   Mixed hyperlipidemia    Chronic.  Reportedly well controlled. We will obtain labs today.      Relevant Orders   CBC with Differential/Platelet   Comprehensive metabolic panel   TSH   T4   T3   VITAMIN D 25 Hydroxy (Vit-D Deficiency, Fractures)   Hemoglobin A1c   LDL cholesterol, direct   Mild intermittent asthma without complication    Chronic.  Well-controlled.  No alarm symptoms present today. Refill on montelukast.      Relevant Medications   montelukast (SINGULAIR) 10 MG tablet   Other fatigue    Moderately increased fatigue in the setting of history of hypothyroidism.  He has been on his current dosage for several months now without a recheck.  We will obtain labs today for further evaluation and make recommendations to the changes in plan of care as necessary based on findings.      Other Visit Diagnoses     Encounter for medical examination to  establish care       Screening for endocrine, nutritional, metabolic and immunity disorder       Relevant Medications   montelukast (SINGULAIR) 10 MG tablet   Other Relevant Orders   CBC with Differential/Platelet   Comprehensive metabolic panel   TSH   T4   T3   VITAMIN D 25 Hydroxy (Vit-D Deficiency, Fractures)   Hemoglobin A1c   LDL cholesterol, direct        Return in about 6 months (around 10/01/2021) for Thyroid.     Connor Meacham, Sung Amabile, NP, DNP, AGNP-C Primary Care & Sports Medicine at [facility]

## 2021-04-01 LAB — CBC WITH DIFFERENTIAL/PLATELET
Basophils Absolute: 0.1 10*3/uL (ref 0.0–0.2)
Basos: 1 %
EOS (ABSOLUTE): 0.2 10*3/uL (ref 0.0–0.4)
Eos: 2 %
Hematocrit: 47.8 % (ref 37.5–51.0)
Hemoglobin: 16.1 g/dL (ref 13.0–17.7)
Immature Grans (Abs): 0 10*3/uL (ref 0.0–0.1)
Immature Granulocytes: 0 %
Lymphocytes Absolute: 2.4 10*3/uL (ref 0.7–3.1)
Lymphs: 21 %
MCH: 29.5 pg (ref 26.6–33.0)
MCHC: 33.7 g/dL (ref 31.5–35.7)
MCV: 88 fL (ref 79–97)
Monocytes Absolute: 0.7 10*3/uL (ref 0.1–0.9)
Monocytes: 6 %
Neutrophils Absolute: 8.2 10*3/uL — ABNORMAL HIGH (ref 1.4–7.0)
Neutrophils: 70 %
Platelets: 210 10*3/uL (ref 150–450)
RBC: 5.46 x10E6/uL (ref 4.14–5.80)
RDW: 12.6 % (ref 11.6–15.4)
WBC: 11.6 10*3/uL — ABNORMAL HIGH (ref 3.4–10.8)

## 2021-04-01 LAB — COMPREHENSIVE METABOLIC PANEL
ALT: 30 IU/L (ref 0–44)
AST: 24 IU/L (ref 0–40)
Albumin/Globulin Ratio: 2.1 (ref 1.2–2.2)
Albumin: 4.9 g/dL (ref 4.0–5.0)
Alkaline Phosphatase: 98 IU/L (ref 44–121)
BUN/Creatinine Ratio: 17 (ref 9–20)
BUN: 17 mg/dL (ref 6–24)
Bilirubin Total: 0.4 mg/dL (ref 0.0–1.2)
CO2: 21 mmol/L (ref 20–29)
Calcium: 9.5 mg/dL (ref 8.7–10.2)
Chloride: 102 mmol/L (ref 96–106)
Creatinine, Ser: 1.02 mg/dL (ref 0.76–1.27)
Globulin, Total: 2.3 g/dL (ref 1.5–4.5)
Glucose: 88 mg/dL (ref 70–99)
Potassium: 4.4 mmol/L (ref 3.5–5.2)
Sodium: 140 mmol/L (ref 134–144)
Total Protein: 7.2 g/dL (ref 6.0–8.5)
eGFR: 92 mL/min/{1.73_m2} (ref >59)

## 2021-04-01 LAB — T4: T4, Total: 14.3 ug/dL — ABNORMAL HIGH (ref 4.5–12.0)

## 2021-04-01 LAB — HEMOGLOBIN A1C
Est. average glucose Bld gHb Est-mCnc: 117 mg/dL
Hgb A1c MFr Bld: 5.7 % — ABNORMAL HIGH (ref 4.8–5.6)

## 2021-04-01 LAB — VITAMIN D 25 HYDROXY (VIT D DEFICIENCY, FRACTURES): Vit D, 25-Hydroxy: 42.9 ng/mL (ref 30.0–100.0)

## 2021-04-01 LAB — T3: T3, Total: 136 ng/dL (ref 71–180)

## 2021-04-01 LAB — LDL CHOLESTEROL, DIRECT: LDL Direct: 72 mg/dL (ref 0–99)

## 2021-04-01 LAB — TSH: TSH: 0.221 u[IU]/mL — ABNORMAL LOW (ref 0.450–4.500)

## 2021-04-04 ENCOUNTER — Telehealth (HOSPITAL_BASED_OUTPATIENT_CLINIC_OR_DEPARTMENT_OTHER): Payer: Self-pay

## 2021-04-04 ENCOUNTER — Ambulatory Visit (HOSPITAL_BASED_OUTPATIENT_CLINIC_OR_DEPARTMENT_OTHER): Payer: Commercial Managed Care - PPO | Admitting: Nurse Practitioner

## 2021-04-04 NOTE — Telephone Encounter (Signed)
Patient reviewed labs through [facility], I am sure appointment gets made. ?

## 2021-04-08 ENCOUNTER — Ambulatory Visit (INDEPENDENT_AMBULATORY_CARE_PROVIDER_SITE_OTHER): Payer: Commercial Managed Care - PPO | Admitting: Nurse Practitioner

## 2021-04-08 ENCOUNTER — Encounter (HOSPITAL_BASED_OUTPATIENT_CLINIC_OR_DEPARTMENT_OTHER): Payer: Self-pay | Admitting: Nurse Practitioner

## 2021-04-08 ENCOUNTER — Other Ambulatory Visit: Payer: Self-pay

## 2021-04-08 DIAGNOSIS — E063 Autoimmune thyroiditis: Secondary | ICD-10-CM

## 2021-04-08 DIAGNOSIS — E038 Other specified hypothyroidism: Secondary | ICD-10-CM | POA: Diagnosis not present

## 2021-04-08 DIAGNOSIS — R7303 Prediabetes: Secondary | ICD-10-CM

## 2021-04-08 MED ORDER — LEVOTHYROXINE SODIUM 125 MCG PO TABS: 125.0000 ug | ORAL_TABLET | Freq: Every day | ORAL | 3 refills | Status: DC

## 2021-04-08 NOTE — Patient Instructions (Signed)
I would like you to try the following: ? ?Dietary Goals ?- carbohydrates to less than 180 grams a day ?- saturated fat to less than 13 grams a day ?- protein intake about 200 -250 grams a day ?- fiber to at least 30 grams a day ? - some patients use benefiber or metamucil to increase their fiber intake ?- keep your empty calories to a minimum (soda, juice, junk food) ? ?Spread these out throughout the day to prevent spikes and drops in your blood sugar. ?Drink at least 64 ounces of water every day.  ? ? ?Exercise Goals ?- Walk at least 10 minutes (20-30 is even better) daily at a pace that gets your heart rate up, but you can still hold a conversation ?- If you do have a higher carb meal or snack, taking an extra 10 minute walk immediately after can help your body burn off that excess sugar.  ? ?Weight loss is helpful for blood sugar because fat cells cause insulin resistance- insulin resistance is what happens to your cells in pre-diabetes and diabetes. Insulin is a "key" to unlock the cell and allow sugar in. In insulin resistance, the cells change the locks and the insulin cannot get into the cell to be used. When weight is lost, the cells return to normal and the insulin works to open the cell once again.  ? ?We will follow-up in 6 months and recheck your labs and see how you are doing. If you have any questions or concerns, do not hesitate to let me know.  ?

## 2021-04-08 NOTE — Progress Notes (Addendum)
Virtual Visit Encounter  telephone visit. ? ? ?I connected with  Howard Alexander on 04/08/21 at  9:30 AM EDT by secure audio enabled telemedicine application. I verified that I am speaking with the correct person using two identifiers. ?  ?I introduced myself as a Designer, jewellery with the practice. The limitations of evaluation and management by telemedicine discussed with the patient and the availability of in person appointments. The patient expressed verbal understanding and consent to proceed. ? ?Participating parties in this visit include: Myself and patient ? ?The patient is: Patient Location: Other:  work ?I am: Provider Location: Office/Clinic ?Subjective/Objective/Recommendations  ? ?CC and HPI: Howard Alexander is a 46 y.o. year old male presenting for new evaluation and treatment of prediabetes and hypothyroid.  ?Pre-DM ?- new diagnosis with A1c 5.7% on recent labs ?- strong family hx of diabetes with multiple co-morbidities ?- patient endorses he would like to avoid this ?- drinks "a lot" of soda a day ?- eager and ready to make changes to prevent progression ?- Recommend ? - carbohydrates less than 180g a day ? - reduce soda intake by at least 1/2 ? - fiber intake 30g a day ? - protein intake to 200-250g a day ? - walk 10-30 minutes a day ? - drink at least 64 ounces of water a day ? - saturated fat intake less than 13g a day ?-F/U  ? - will recheck A1c in 6 months ? ? ?Hypothyroid ?- recent labs show TSH low ?- needs refills ?- no sx ?- Recommend ? - to finish current medication take 1 tab levothyroxine 120mcg by mouth 4 days a week and 1/2 tab levothyroxine 87.73mcg 3 days a week until current prescription is finished ?- will send new prescription for 1 tab 162mcg daily to pharmacy ?- recheck labs in 8 weeks ? ?Past medical history, Surgical history, Family history not pertinant except as noted below, Social history, Allergies, and medications have been entered into the medical record, reviewed, and  corrections made.  ? ?Review of Systems:  ?All review of systems negative except what is listed in the HPI ? ?Alert and oriented x 4 ?Speaking in clear sentences with no shortness of breath. ?No distress. ? ?orders and follow up as documented in EMR ?I discussed the assessment and treatment plan with the patient. The patient was provided an opportunity to ask questions and all were answered. The patient agreed with the plan and demonstrated an understanding of the instructions. ?  ?The patient was advised to call back or seek an in-person evaluation if the symptoms worsen or if the condition fails to improve as anticipated. ? ?Follow-Up: 8 weeks thyroid test- lab only, 6 months f/u visit ? ?I provided 23 minutes of non-face-to-face interaction with this non face-to-face encounter including intake, same-day documentation, and chart review.  ? ?Orma Render, NP , DNP, AGNP-c ?[facility] ?Primary Care & Sports Medicine at [facility] ?[phone] ?[phone] (fax) ? ?

## 2021-05-29 ENCOUNTER — Ambulatory Visit (INDEPENDENT_AMBULATORY_CARE_PROVIDER_SITE_OTHER): Payer: Commercial Managed Care - PPO | Admitting: Nurse Practitioner

## 2021-05-29 ENCOUNTER — Encounter (HOSPITAL_BASED_OUTPATIENT_CLINIC_OR_DEPARTMENT_OTHER): Payer: Self-pay | Admitting: Nurse Practitioner

## 2021-05-29 ENCOUNTER — Other Ambulatory Visit (HOSPITAL_BASED_OUTPATIENT_CLINIC_OR_DEPARTMENT_OTHER): Payer: Self-pay | Admitting: Nurse Practitioner

## 2021-05-29 VITALS — BP 122/82 | HR 75 | Ht 68.0 in | Wt 225.0 lb

## 2021-05-29 DIAGNOSIS — R3989 Other symptoms and signs involving the genitourinary system: Secondary | ICD-10-CM

## 2021-05-29 LAB — POCT URINALYSIS DIPSTICK
Bilirubin, UA: NEGATIVE
Glucose, UA: NEGATIVE
Ketones, UA: NEGATIVE
Leukocytes, UA: NEGATIVE
Nitrite, UA: NEGATIVE
Protein, UA: POSITIVE — AB
Spec Grav, UA: 1.025 (ref 1.010–1.025)
Urobilinogen, UA: 0.2 E.U./dL
pH, UA: 7 (ref 5.0–8.0)

## 2021-05-29 MED ORDER — NITROFURANTOIN MONOHYD MACRO 100 MG PO CAPS: 100.0000 mg | ORAL_CAPSULE | Freq: Two times a day (BID) | ORAL | 0 refills | Status: DC

## 2021-05-29 MED ORDER — TAMSULOSIN HCL 0.4 MG PO CAPS: 0.4000 mg | ORAL_CAPSULE | Freq: Every day | ORAL | 3 refills | Status: DC

## 2021-05-29 NOTE — Progress Notes (Signed)
? ?Acute Office Visit ? ?Subjective:  ? ?  ?Patient ID: Howard Alexander, male    DOB: 05-May-1975, 46 y.o.   MRN: [id number] ? ?Chief Complaint  ?Patient presents with  ? Urinary symptoms  ? ? ?HPI ?Patient is in today for evaluation for suprapubic pressure. ?Howard Alexander tells me that on Monday he felt the urge to urinate and when he was completed he continued to feel "pressure".  He was unable to release any more urine.  This symptoms seem to resolve throughout the day.  He reports that on Tuesday after work he noticed the pressure again.  He tells me that he was urinating every 20 to 30 minutes and small volume voids.  He did not have any hesitation or weak stream with his urination.  No color changes and no blood were present.  He tells me he thought he may have been constipated and he took a laxative.  He reports after the laxative worked he did feel relief in his bladder pressure however it has returned. ?He tells me that he is dealing with a constant pressure like he needs to urinate even after complete bladder emptying. ?He does not have any history of urinary problems but his father does have history of prostate issues. ?He denies fever, chills, back pain, new sexual partners, nausea, vomiting. ?He does endorse that approximately month ago he did have darker than usual urine a few times but he thought this was may be dehydration and with increased water intake it is resolved. ? ?ROS ?All review of systems negative except what is listed in the HPI ? ? ?   ?Objective:  ?  ?BP 122/82   Pulse 75   Ht 5\' 8"  (1.727 m)   Wt 225 lb (102.1 kg)   SpO2 97%   BMI 34.21 kg/m?  ? ?Physical Exam ?Vitals and nursing note reviewed.  ?Constitutional:   ?   General: He is not in acute distress. ?   Appearance: Normal appearance.  ?HENT:  ?   Head: Normocephalic and atraumatic.  ?   Right Ear: Hearing normal.  ?   Left Ear: Hearing normal.  ?   Mouth/Throat:  ?   Lips: Pink.  ?Eyes:  ?   General: Lids are normal. Vision grossly  intact.  ?   Extraocular Movements: Extraocular movements intact.  ?   Conjunctiva/sclera: Conjunctivae normal.  ?   Pupils: Pupils are equal, round, and reactive to light.  ?   Funduscopic exam: ?   Right eye: No hemorrhage. Red reflex present.     ?   Left eye: No hemorrhage. Red reflex present. ?   Visual Fields: Right eye visual fields normal and left eye visual fields normal.  ?Neck:  ?   Thyroid: No thyromegaly.  ?   Vascular: No carotid bruit or JVD.  ?Cardiovascular:  ?   Rate and Rhythm: Normal rate and regular rhythm.  ?   Chest Wall: PMI is not displaced.  ?   Pulses: Normal pulses.  ?   Heart sounds: No murmur heard. ?Pulmonary:  ?   Effort: Pulmonary effort is normal. No respiratory distress.  ?   Breath sounds: Normal breath sounds.  ?Abdominal:  ?   General: Bowel sounds are normal. There is no distension.  ?   Palpations: Abdomen is soft.  ?   Tenderness: There is no abdominal tenderness. There is no right CVA tenderness, left CVA tenderness or guarding.  ? ? ?Musculoskeletal:     ?  General: Normal range of motion.  ?   Cervical back: Normal range of motion. No tenderness.  ?   Right lower leg: No edema.  ?   Left lower leg: No edema.  ?Skin: ?   General: Skin is warm.  ?   Capillary Refill: Capillary refill takes less than 2 seconds.  ?Neurological:  ?   General: No focal deficit present.  ?   Mental Status: He is alert and oriented to person, place, and time.  ?   Motor: No weakness.  ?Psychiatric:     ?   Attention and Perception: Attention normal.     ?   Mood and Affect: Mood normal.     ?   Speech: Speech normal.     ?   Behavior: Behavior normal. Behavior is cooperative.     ?   Thought Content: Thought content normal.     ?   Cognition and Memory: Cognition and memory normal.     ?   Judgment: Judgment normal.  ? ? ?Results for orders placed or performed in visit on 05/29/21  ?Urine Culture  ? UR  ?Result Value Ref Range  ? Urine Culture, Routine Final report   ? Organism ID, Bacteria No  growth   ?Results for orders placed or performed in visit on 05/29/21  ?PSA Total (Reflex To Free)  ?Result Value Ref Range  ? Prostate Specific Ag, Serum 0.6 0.0 - 4.0 ng/mL  ? Reflex Criteria Comment   ?POCT Urinalysis Dipstick  ?Result Value Ref Range  ? Color, UA yellow   ? Clarity, UA clear   ? Glucose, UA Negative Negative  ? Bilirubin, UA neg   ? Ketones, UA neg   ? Spec Grav, UA 1.025 1.010 - 1.025  ? Blood, UA large   ? pH, UA 7.0 5.0 - 8.0  ? Protein, UA Positive (A) Negative  ? Urobilinogen, UA 0.2 0.2 or 1.0 E.U./dL  ? Nitrite, UA neg   ? Leukocytes, UA Negative Negative  ? Appearance    ? Odor    ? ? ? ?   ?Assessment & Plan:  ? ?Problem List Items Addressed This Visit   ? ? Bladder pain - Primary  ?  Symptoms of increased urinary and suprapubic pressure with frequent voiding of unknown etiology.  UA today does show a small amount of blood present.  There are no signs indicating possible kidney stone however encourage patient to monitor closely for this. ?Given the patient's symptoms and presence of blood in the UA in the office today we will begin a prophylactic treatment with Macrobid and Flomax.  We will also send urine off for culture to ensure no growth.  Will also obtain PSA for evaluation. ?If laboratory work is negative and symptoms continue recommend urology referral for further evaluation and recommendations. ?Patient will follow-up if symptoms worsen or fail to improve. ? ?  ?  ? Relevant Medications  ? tamsulosin (FLOMAX) 0.4 MG CAPS capsule  ? nitrofurantoin, macrocrystal-monohydrate, (MACROBID) 100 MG capsule  ? Other Relevant Orders  ? POCT Urinalysis Dipstick (Completed)  ? PSA Total (Reflex To Free) (Completed)  ? Comprehensive metabolic panel  ? Urine Culture  ? ? ?Meds ordered this encounter  ?Medications  ? tamsulosin (FLOMAX) 0.4 MG CAPS capsule  ?  Sig: Take 1 capsule (0.4 mg total) by mouth daily.  ?  Dispense:  30 capsule  ?  Refill:  3  ? nitrofurantoin,  macrocrystal-monohydrate, (MACROBID) 100 MG  capsule  ?  Sig: Take 1 capsule (100 mg total) by mouth 2 (two) times daily.  ?  Dispense:  10 capsule  ?  Refill:  0  ? ? ?Return for TBD based on labs. ? ?Orma Render, NP ? ? ?

## 2021-05-29 NOTE — Patient Instructions (Signed)
It was a pleasure seeing you today. I hope your time spent with [place] was pleasant and helpful. Please let us know if there is anything we can do to improve the service you receive.  ? ?Today we discussed concerns with: ? ?Bladder pain ?I have sent in flomax and macrobid to help with the symptoms. Once we have the results back this should give [place] a better indication of what is going on.  ? ?The following orders have been placed for you today: ? ?Orders Placed This Encounter  ?Procedures  ? Urine Culture  ? PSA Total (Reflex To Free)  ? Comprehensive metabolic panel  ?  Standing Status:   Future  ?  Standing Expiration Date:   05/30/2022  ?  Order Specific Question:   Has the patient fasted?  ?  Answer:   Yes  ?  Order Specific Question:   Release to patient  ?  Answer:   Immediate  ? POCT Urinalysis Dipstick  ? ? ? ?Important Office Information ?Lab Results ?If labs were ordered, please note that you will see results through MyChart as soon as they come available from [facility].  ?It takes up to 5 business days for the results to be routed to me and for me to review them once all of the lab results have come through from [facility]. I will make recommendations based on your results and send these through MyChart or someone from the office will call you to discuss. If your labs are abnormal, we may contact you to schedule a visit to discuss the results and make recommendations.  ?If you have not heard from [place] within 5 business days or you have waited longer than a week and your lab results have not come through on MyChart, please feel free to call the office or send a message through MyChart to follow-up on these labs.  ? ?Referrals ?If referrals were placed today, the office where the referral was sent will contact you either by phone or through MyChart to set up scheduling. Please note that it can take up to a week for the referral office to contact you. If you do not hear from them in a week, please contact the referral  office directly to inquire about scheduling.  ? ?Condition Treated ?If your condition worsens or you begin to have new symptoms, please schedule a follow-up appointment for further evaluation. If you are not sure if an appointment is needed, you may call the office to leave a message for the nurse and someone will contact you with recommendations.  ?If you have an urgent or life threatening emergency, please do not call the office, but seek emergency evaluation by calling 911 or going to the nearest emergency room for evaluation.  ? ?MyChart and Phone Calls ?Please do not use MyChart for urgent messages. It may take up to 3 business days for MyChart messages to be read by staff and if they are unable to handle the request, an additional 3 business days for them to be routed to me and for my response.  ?Messages sent to the provider through MyChart do not come directly to the provider, please allow time for these messages to be routed and for me to respond.  ?We get a large volume of MyChart messages daily and these are responded to in the order received.  ? ?For urgent messages, please call the office at [phone] and speak with the front office staff or leave a message on the line  of my assistant for guidance.  ?We are seeing patients from the hours of 8:00 am through 5:00 pm and calls directly to the nurse may not be answered immediately due to seeing patients, but your call will be returned as soon as possible.  ?Phone  messages received after 4:00 PM Monday through Thursday may not be returned until the following business day. Phone messages received after 11:00 AM on Friday may not be returned until Monday.  ? ?After Hours ?We share on call hours with providers from other offices. If you have an urgent need after hours that cannot wait until the next business day, please contact the on call provider by calling the office number. A nurse will speak with you and contact the provider if needed for  recommendations.  ?If you have an urgent or life threatening emergency after hours, please do not call the on call provider, but seek emergency evaluation by calling 911 or going to the nearest emergency room for evaluation.  ? ?Paperwork ?All paperwork requires a minimum of 5 days to complete and return to you or the designated personnel. Please keep this in mind when bringing in forms or sending requests for paperwork completion to the office.  ?  ?

## 2021-05-30 LAB — PSA TOTAL (REFLEX TO FREE): Prostate Specific Ag, Serum: 0.6 ng/mL (ref 0.0–4.0)

## 2021-05-31 LAB — URINE CULTURE: Organism ID, Bacteria: NO GROWTH

## 2021-06-03 ENCOUNTER — Encounter (HOSPITAL_BASED_OUTPATIENT_CLINIC_OR_DEPARTMENT_OTHER): Payer: Self-pay | Admitting: Nurse Practitioner

## 2021-06-06 DIAGNOSIS — R3989 Other symptoms and signs involving the genitourinary system: Secondary | ICD-10-CM | POA: Insufficient documentation

## 2021-06-06 NOTE — Assessment & Plan Note (Signed)
Symptoms of increased urinary and suprapubic pressure with frequent voiding of unknown etiology.  UA today does show a small amount of blood present.  There are no signs indicating possible kidney stone however encourage patient to monitor closely for this. ?Given the patient's symptoms and presence of blood in the UA in the office today we will begin a prophylactic treatment with Macrobid and Flomax.  We will also send urine off for culture to ensure no growth.  Will also obtain PSA for evaluation. ?If laboratory work is negative and symptoms continue recommend urology referral for further evaluation and recommendations. ?Patient will follow-up if symptoms worsen or fail to improve. ?

## 2021-06-17 ENCOUNTER — Other Ambulatory Visit (HOSPITAL_BASED_OUTPATIENT_CLINIC_OR_DEPARTMENT_OTHER): Payer: Self-pay | Admitting: Nurse Practitioner

## 2021-06-17 DIAGNOSIS — R3989 Other symptoms and signs involving the genitourinary system: Secondary | ICD-10-CM

## 2021-06-18 ENCOUNTER — Other Ambulatory Visit (HOSPITAL_BASED_OUTPATIENT_CLINIC_OR_DEPARTMENT_OTHER): Payer: Self-pay

## 2021-06-18 DIAGNOSIS — R3989 Other symptoms and signs involving the genitourinary system: Secondary | ICD-10-CM

## 2021-06-23 ENCOUNTER — Encounter (HOSPITAL_BASED_OUTPATIENT_CLINIC_OR_DEPARTMENT_OTHER): Payer: Self-pay | Admitting: Nurse Practitioner

## 2021-06-24 ENCOUNTER — Other Ambulatory Visit (HOSPITAL_BASED_OUTPATIENT_CLINIC_OR_DEPARTMENT_OTHER): Payer: Self-pay

## 2021-06-24 DIAGNOSIS — E038 Other specified hypothyroidism: Secondary | ICD-10-CM

## 2021-06-25 ENCOUNTER — Ambulatory Visit (HOSPITAL_COMMUNITY)
Admission: RE | Admit: 2021-06-25 | Discharge: 2021-06-25 | Disposition: A | Discharge status: Home / Self Care | Payer: Commercial Managed Care - PPO | Source: Ambulatory Visit | Attending: Urology | Admitting: Urology

## 2021-06-25 ENCOUNTER — Encounter: Payer: Self-pay | Admitting: Urology

## 2021-06-25 ENCOUNTER — Ambulatory Visit (INDEPENDENT_AMBULATORY_CARE_PROVIDER_SITE_OTHER): Payer: Commercial Managed Care - PPO | Admitting: Urology

## 2021-06-25 ENCOUNTER — Ambulatory Visit (HOSPITAL_BASED_OUTPATIENT_CLINIC_OR_DEPARTMENT_OTHER): Payer: Commercial Managed Care - PPO

## 2021-06-25 VITALS — BP 112/75 | HR 80

## 2021-06-25 DIAGNOSIS — R3129 Other microscopic hematuria: Secondary | ICD-10-CM | POA: Diagnosis not present

## 2021-06-25 DIAGNOSIS — R3989 Other symptoms and signs involving the genitourinary system: Secondary | ICD-10-CM

## 2021-06-25 DIAGNOSIS — N200 Calculus of kidney: Secondary | ICD-10-CM | POA: Insufficient documentation

## 2021-06-25 DIAGNOSIS — R339 Retention of urine, unspecified: Secondary | ICD-10-CM | POA: Diagnosis not present

## 2021-06-25 DIAGNOSIS — E038 Other specified hypothyroidism: Secondary | ICD-10-CM

## 2021-06-25 LAB — URINALYSIS, ROUTINE W REFLEX MICROSCOPIC
Bilirubin, UA: NEGATIVE
Glucose, UA: NEGATIVE
Ketones, UA: NEGATIVE
Leukocytes,UA: NEGATIVE
Nitrite, UA: NEGATIVE
Protein,UA: NEGATIVE
Specific Gravity, UA: 1.01 (ref 1.005–1.030)
Urobilinogen, Ur: 0.2 mg/dL (ref 0.2–1.0)
pH, UA: 7 (ref 5.0–7.5)

## 2021-06-25 LAB — MICROSCOPIC EXAMINATION
Epithelial Cells (non renal): NONE SEEN /hpf (ref 0–10)
Renal Epithel, UA: NONE SEEN /hpf
WBC, UA: NONE SEEN /hpf (ref 0–5)

## 2021-06-25 MED ORDER — TAMSULOSIN HCL 0.4 MG PO CAPS: 0.4000 mg | ORAL_CAPSULE | Freq: Every day | ORAL | 1 refills | Status: DC

## 2021-06-25 MED ORDER — OXYCODONE-ACETAMINOPHEN 5-325 MG PO TABS: 1.0000 | ORAL_TABLET | ORAL | 0 refills | Status: DC | PRN

## 2021-06-25 NOTE — Patient Instructions (Signed)
Dietary Guidelines to Help Prevent Kidney Stones Kidney stones are deposits of minerals and salts that form inside your kidneys. Your risk of developing kidney stones may be greater depending on your diet, your lifestyle, the medicines you take, and whether you have certain medical conditions. Most people can lower their chances of developing kidney stones by following the instructions below. Your dietitian may give you more specific instructions depending on your overall health and the type of kidney stones you tend to develop. What are tips for following this plan? Reading food labels  Choose foods with "no salt added" or "low-salt" labels. Limit your salt (sodium) intake to less than 1,500 mg a day. Choose foods with calcium for each meal and snack. Try to eat about 300 mg of calcium at each meal. Foods that contain 200-500 mg of calcium a serving include: 8 oz (237 mL) of milk, calcium-fortifiednon-dairy milk, and calcium-fortifiedfruit juice. Calcium-fortified means that calcium has been added to these drinks. 8 oz (237 mL) of kefir, yogurt, and soy yogurt. 4 oz (114 g) of tofu. 1 oz (28 g) of cheese. 1 cup (150 g) of dried figs. 1 cup (91 g) of cooked broccoli. One 3 oz (85 g) can of sardines or mackerel. Most people need 1,000-1,500 mg of calcium a day. Talk to your dietitian about how much calcium is recommended for you. Shopping Buy plenty of fresh fruits and vegetables. Most people do not need to avoid fruits and vegetables, even if these foods contain nutrients that may contribute to kidney stones. When shopping for convenience foods, choose: Whole pieces of fruit. Pre-made salads with dressing on the side. Low-fat fruit and yogurt smoothies. Avoid buying frozen meals or prepared deli foods. These can be high in sodium. Look for foods with live cultures, such as yogurt and kefir. Choose high-fiber grains, such as whole-wheat breads, oat bran, and wheat cereals. Cooking Do not add  salt to food when cooking. Place a salt shaker on the table and allow each person to add his or her own salt to taste. Use vegetable protein, such as beans, textured vegetable protein (TVP), or tofu, instead of meat in pasta, casseroles, and soups. Meal planning Eat less salt, if told by your dietitian. To do this: Avoid eating processed or pre-made food. Avoid eating fast food. Eat less animal protein, including cheese, meat, poultry, or fish, if told by your dietitian. To do this: Limit the number of times you have meat, poultry, fish, or cheese each week. Eat a diet free of meat at least 2 days a week. Eat only one serving each day of meat, poultry, fish, or seafood. When you prepare animal protein, cut pieces into small portion sizes. For most meat and fish, one serving is about the size of the palm of your hand. Eat at least five servings of fresh fruits and vegetables each day. To do this: Keep fruits and vegetables on hand for snacks. Eat one piece of fruit or a handful of berries with breakfast. Have a salad and fruit at lunch. Have two kinds of vegetables at dinner. Limit foods that are high in a substance called oxalate. These include: Spinach (cooked), rhubarb, beets, sweet potatoes, and Swiss chard. Peanuts. Potato chips, french fries, and baked potatoes with skin on. Nuts and nut products. Chocolate. If you regularly take a diuretic medicine, make sure to eat at least 1 or 2 servings of fruits or vegetables that are high in potassium each day. These include: Avocado. Banana. Orange, prune,   carrot, or tomato juice. Baked potato. Cabbage. Beans and split peas. Lifestyle  Drink enough fluid to keep your urine pale yellow. This is the most important thing you can do. Spread your fluid intake throughout the day. If you drink alcohol: Limit how much you use to: 0-1 drink a day for women who are not pregnant. 0-2 drinks a day for men. Be aware of how much alcohol is in your  drink. In the [place] one drink equals one 12 oz bottle of beer (355 mL), one 5 oz glass of wine (148 mL), or one 1 oz glass of hard liquor (44 mL). Lose weight if told by your health care provider. Work with your dietitian to find an eating plan and weight loss strategies that work best for you. General information Talk to your health care provider and dietitian about taking daily supplements. You may be told the following depending on your health and the cause of your kidney stones: Not to take supplements with vitamin C. To take a calcium supplement. To take a daily probiotic supplement. To take other supplements such as magnesium, fish oil, or vitamin B6. Take over-the-counter and prescription medicines only as told by your health care provider. These include supplements. What foods should I limit? Limit your intake of the following foods, or eat them as told by your dietitian. Vegetables Spinach. Rhubarb. Beets. Canned vegetables. Pickles. Olives. Baked potatoes with skin. Grains Wheat bran. Baked goods. Salted crackers. Cereals high in sugar. Meats and other proteins Nuts. Nut butters. Large portions of meat, poultry, or fish. Salted, precooked, or cured meats, such as sausages, meat loaves, and hot dogs. Dairy Cheese. Beverages Regular soft drinks. Regular vegetable juice. Seasonings and condiments Seasoning blends with salt. Salad dressings. Soy sauce. Ketchup. Barbecue sauce. Other foods Canned soups. Canned pasta sauce. Casseroles. Pizza. Lasagna. Frozen meals. Potato chips. French fries. The items listed above may not be a complete list of foods and beverages you should limit. Contact a dietitian for more information. What foods should I avoid? Talk to your dietitian about specific foods you should avoid based on the type of kidney stones you have and your overall health. Fruits Grapefruit. The item listed above may not be a complete list of foods and beverages you should  avoid. Contact a dietitian for more information. Summary Kidney stones are deposits of minerals and salts that form inside your kidneys. You can lower your risk of kidney stones by making changes to your diet. The most important thing you can do is drink enough fluid. Drink enough fluid to keep your urine pale yellow. Talk to your dietitian about how much calcium you should have each day, and eat less salt and animal protein as told by your dietitian. This information is not intended to replace advice given to you by your health care provider. Make sure you discuss any questions you have with your health care provider. Document Revised: 09/23/2020 Document Reviewed: 09/23/2020 Elsevier Patient Education  2023 [employer]  

## 2021-06-25 NOTE — H&P (View-Only) (Signed)
 06/25/2021 11:23 AM   Howard Alexander 11/17/1975 [id number]  Referring provider: Early, Sara E, NP [address] [place],  [place] [address]  Difficulty urinating   HPI: For the past month he has noted increased urinary frequency, urgency and difficulty urinating. He was diagnosed with a UTI and started on macrobid and flomax. These failed to improve his LUTS. This weekend he presented to the ER in [place], [place] and was diagnosed with a kidney stone and rhabdomyolysis. He was having left flank pain at the time. He was told it was 6mm. He has mild left flank pain. He continues to have urinary urgency and frequency. KUB from today shows a 6mm left distal ureteral calculus.    PMH: Past Medical History:  Diagnosis Date   Allergy    Seasonal/pollen   Asthma    Hypercholesterolemia    Hypothyroidism due to Hashimoto's thyroiditis 03/31/2021    Surgical History: No past surgical history on file.  Home Medications:  Allergies as of 06/25/2021   Not on File      Medication List        Accurate as of Jun 25, 2021 11:23 AM. If you have any questions, ask your nurse or doctor.          albuterol 108 (90 Base) MCG/ACT inhaler Commonly known as: VENTOLIN HFA SMARTSIG:1 Puff(s) Via Inhaler Every 4-6 Hours PRN   atorvastatin 20 MG tablet Commonly known as: LIPITOR   ketorolac 10 MG tablet Commonly known as: TORADOL Take 10 mg by mouth every 6 (six) hours.   levothyroxine 125 MCG tablet Commonly known as: SYNTHROID Take 1 tablet (125 mcg total) by mouth daily.   montelukast 10 MG tablet Commonly known as: SINGULAIR SMARTSIG:1 Tablet(s) By Mouth Every Evening   nitrofurantoin (macrocrystal-monohydrate) 100 MG capsule Commonly known as: MACROBID Take 1 capsule (100 mg total) by mouth 2 (two) times daily.   tamsulosin 0.4 MG Caps capsule Commonly known as: FLOMAX TAKE 1 CAPSULE BY MOUTH EVERY DAY        Allergies: Not on File  Family  History: Family History  Problem Relation Age of Onset   Cancer Father    ADD / ADHD Son     Social History:  reports that he has never smoked. He has never been exposed to tobacco smoke. He has never used smokeless tobacco. He reports that he does not currently use alcohol after a past usage of about 1.0 standard drink per week. He reports that he does not use drugs.  ROS: All other review of systems were reviewed and are negative except what is noted above in HPI  Physical Exam: BP 112/75   Pulse 80   Constitutional:  Alert and oriented, No acute distress. HEENT: [place] AT, moist mucus membranes.  Trachea midline, no masses. Cardiovascular: No clubbing, cyanosis, or edema. Respiratory: Normal respiratory effort, no increased work of breathing. GI: Abdomen is soft, nontender, nondistended, no abdominal masses GU: No CVA tenderness. Circumcised phallus. No masses/lesions on penis, testis, scrotum. Prostate 40g smooth no nodules no induration.  Lymph: No cervical or inguinal lymphadenopathy. Skin: No rashes, bruises or suspicious lesions. Neurologic: Grossly intact, no focal deficits, moving all 4 extremities. Psychiatric: Normal mood and affect.  Laboratory Data: Lab Results  Component Value Date   WBC 11.6 (H) 03/31/2021   HGB 16.1 03/31/2021   HCT 47.8 03/31/2021   MCV 88 03/31/2021   PLT 210 03/31/2021    Lab Results  Component Value Date     CREATININE 1.02 03/31/2021    No results found for: PSA  No results found for: TESTOSTERONE  Lab Results  Component Value Date   HGBA1C 5.7 (H) 03/31/2021    Urinalysis    Component Value Date/Time   COLORURINE YELLOW 10/14/2020 1401   APPEARANCEUR HAZY (A) 10/14/2020 1401   LABSPEC 1.040 (H) 10/14/2020 1401   PHURINE 6.0 10/14/2020 1401   GLUCOSEU NEGATIVE 10/14/2020 1401   HGBUR NEGATIVE 10/14/2020 1401   BILIRUBINUR neg 05/29/2021 0856   KETONESUR NEGATIVE 10/14/2020 1401   PROTEINUR Positive (A) 05/29/2021 0856    PROTEINUR 30 (A) 10/14/2020 1401   UROBILINOGEN 0.2 05/29/2021 0856   NITRITE neg 05/29/2021 0856   NITRITE NEGATIVE 10/14/2020 1401   LEUKOCYTESUR Negative 05/29/2021 0856   LEUKOCYTESUR NEGATIVE 10/14/2020 1401    No results found for: LABMICR, WBCUA, RBCUA, LABEPIT, MUCUS, BACTERIA  Pertinent Imaging: KUB today: Images reviewed and discussed with the patient  No results found for this or any previous visit.  No results found for this or any previous visit.  No results found for this or any previous visit.  No results found for this or any previous visit.  No results found for this or any previous visit.  No results found for this or any previous visit.  No results found for this or any previous visit.  No results found for this or any previous visit.   Assessment & Plan:    1. Incomplete emptying of bladder -continue flomax 0.4mg daily  2. Nephrolithiasis -We discussed the management of kidney stones. These options include observation, ureteroscopy, shockwave lithotripsy (ESWL) and percutaneous nephrolithotomy (PCNL). We discussed which options are relevant to the patient's stone(s). We discussed the natural history of kidney stones as well as the complications of untreated stones and the impact on quality of life without treatment as well as with each of the above listed treatments. We also discussed the efficacy of each treatment in its ability to clear the stone burden. With any of these management options I discussed the signs and symptoms of infection and the need for emergent treatment should these be experienced. For each option we discussed the ability of each procedure to clear the patient of their stone burden.   For observation I described the risks which include but are not limited to silent renal damage, life-threatening infection, need for emergent surgery, failure to pass stone and pain.   For ureteroscopy I described the risks which include bleeding,  infection, damage to contiguous structures, positioning injury, ureteral stricture, ureteral avulsion, ureteral injury, need for prolonged ureteral stent, inability to perform ureteroscopy, need for an interval procedure, inability to clear stone burden, stent discomfort/pain, heart attack, stroke, pulmonary embolus and the inherent risks with general anesthesia.   For shockwave lithotripsy I described the risks which include arrhythmia, kidney contusion, kidney hemorrhage, need for transfusion, pain, inability to adequately break up stone, inability to pass stone fragments, Steinstrasse, infection associated with obstructing stones, need for alternate surgical procedure, need for repeat shockwave lithotripsy, MI, CVA, PE and the inherent risks with anesthesia/conscious sedation.   For PCNL I described the risks including positioning injury, pneumothorax, hydrothorax, need for chest tube, inability to clear stone burden, renal laceration, arterial venous fistula or malformation, need for embolization of kidney, loss of kidney or renal function, need for repeat procedure, need for prolonged nephrostomy tube, ureteral avulsion, MI, CVA, PE and the inherent risks of general anesthesia.   - The patient would like to proceed with left ureteroscopic   stone extraction.    No follow-ups on file.  Jamilia Jacques, MD  [facility]   

## 2021-06-25 NOTE — Progress Notes (Signed)
post void residual =143  

## 2021-06-25 NOTE — Progress Notes (Signed)
06/25/2021 11:23 AM   Jeanann Lewandowsky Jun 16, 1975 [id number]  Referring provider: Orma Render, NP [address] [place],  [place] [address]  Difficulty urinating   HPI: For the past month he has noted increased urinary frequency, urgency and difficulty urinating. He was diagnosed with a UTI and started on macrobid and flomax. These failed to improve his LUTS. This weekend he presented to the ER in [place], [place] and was diagnosed with a kidney stone and rhabdomyolysis. He was having left flank pain at the time. He was told it was 5mm. He has mild left flank pain. He continues to have urinary urgency and frequency. KUB from today shows a 89mm left distal ureteral calculus.    PMH: Past Medical History:  Diagnosis Date   Allergy    Seasonal/pollen   Asthma    Hypercholesterolemia    Hypothyroidism due to Hashimoto's thyroiditis 03/31/2021    Surgical History: No past surgical history on file.  Home Medications:  Allergies as of 06/25/2021   Not on File      Medication List        Accurate as of Jun 25, 2021 11:23 AM. If you have any questions, ask your nurse or doctor.          albuterol 108 (90 Base) MCG/ACT inhaler Commonly known as: VENTOLIN HFA SMARTSIG:1 Puff(s) Via Inhaler Every 4-6 Hours PRN   atorvastatin 20 MG tablet Commonly known as: LIPITOR   ketorolac 10 MG tablet Commonly known as: TORADOL Take 10 mg by mouth every 6 (six) hours.   levothyroxine 125 MCG tablet Commonly known as: SYNTHROID Take 1 tablet (125 mcg total) by mouth daily.   montelukast 10 MG tablet Commonly known as: SINGULAIR SMARTSIG:1 Tablet(s) By Mouth Every Evening   nitrofurantoin (macrocrystal-monohydrate) 100 MG capsule Commonly known as: MACROBID Take 1 capsule (100 mg total) by mouth 2 (two) times daily.   tamsulosin 0.4 MG Caps capsule Commonly known as: FLOMAX TAKE 1 CAPSULE BY MOUTH EVERY DAY        Allergies: Not on File  Family  History: Family History  Problem Relation Age of Onset   Cancer Father    ADD / ADHD Son     Social History:  reports that he has never smoked. He has never been exposed to tobacco smoke. He has never used smokeless tobacco. He reports that he does not currently use alcohol after a past usage of about 1.0 standard drink per week. He reports that he does not use drugs.  ROS: All other review of systems were reviewed and are negative except what is noted above in HPI  Physical Exam: BP 112/75   Pulse 80   Constitutional:  Alert and oriented, No acute distress. HEENT:  AT, moist mucus membranes.  Trachea midline, no masses. Cardiovascular: No clubbing, cyanosis, or edema. Respiratory: Normal respiratory effort, no increased work of breathing. GI: Abdomen is soft, nontender, nondistended, no abdominal masses GU: No CVA tenderness. Circumcised phallus. No masses/lesions on penis, testis, scrotum. Prostate 40g smooth no nodules no induration.  Lymph: No cervical or inguinal lymphadenopathy. Skin: No rashes, bruises or suspicious lesions. Neurologic: Grossly intact, no focal deficits, moving all 4 extremities. Psychiatric: Normal mood and affect.  Laboratory Data: Lab Results  Component Value Date   WBC 11.6 (H) 03/31/2021   HGB 16.1 03/31/2021   HCT 47.8 03/31/2021   MCV 88 03/31/2021   PLT 210 03/31/2021    Lab Results  Component Value Date  CREATININE 1.02 03/31/2021    No results found for: PSA  No results found for: TESTOSTERONE  Lab Results  Component Value Date   HGBA1C 5.7 (H) 03/31/2021    Urinalysis    Component Value Date/Time   COLORURINE YELLOW 10/14/2020 1401   APPEARANCEUR HAZY (A) 10/14/2020 1401   LABSPEC 1.040 (H) 10/14/2020 1401   PHURINE 6.0 10/14/2020 1401   GLUCOSEU NEGATIVE 10/14/2020 1401   HGBUR NEGATIVE 10/14/2020 1401   BILIRUBINUR neg 05/29/2021 0856   KETONESUR NEGATIVE 10/14/2020 1401   PROTEINUR Positive (A) 05/29/2021 0856    PROTEINUR 30 (A) 10/14/2020 1401   UROBILINOGEN 0.2 05/29/2021 0856   NITRITE neg 05/29/2021 0856   NITRITE NEGATIVE 10/14/2020 1401   LEUKOCYTESUR Negative 05/29/2021 0856   LEUKOCYTESUR NEGATIVE 10/14/2020 1401    No results found for: LABMICR, WBCUA, RBCUA, LABEPIT, MUCUS, BACTERIA  Pertinent Imaging: KUB today: Images reviewed and discussed with the patient  No results found for this or any previous visit.  No results found for this or any previous visit.  No results found for this or any previous visit.  No results found for this or any previous visit.  No results found for this or any previous visit.  No results found for this or any previous visit.  No results found for this or any previous visit.  No results found for this or any previous visit.   Assessment & Plan:    1. Incomplete emptying of bladder -continue flomax 0.4mg  daily  2. Nephrolithiasis -We discussed the management of kidney stones. These options include observation, ureteroscopy, shockwave lithotripsy (ESWL) and percutaneous nephrolithotomy (PCNL). We discussed which options are relevant to the patient's stone(s). We discussed the natural history of kidney stones as well as the complications of untreated stones and the impact on quality of life without treatment as well as with each of the above listed treatments. We also discussed the efficacy of each treatment in its ability to clear the stone burden. With any of these management options I discussed the signs and symptoms of infection and the need for emergent treatment should these be experienced. For each option we discussed the ability of each procedure to clear the patient of their stone burden.   For observation I described the risks which include but are not limited to silent renal damage, life-threatening infection, need for emergent surgery, failure to pass stone and pain.   For ureteroscopy I described the risks which include bleeding,  infection, damage to contiguous structures, positioning injury, ureteral stricture, ureteral avulsion, ureteral injury, need for prolonged ureteral stent, inability to perform ureteroscopy, need for an interval procedure, inability to clear stone burden, stent discomfort/pain, heart attack, stroke, pulmonary embolus and the inherent risks with general anesthesia.   For shockwave lithotripsy I described the risks which include arrhythmia, kidney contusion, kidney hemorrhage, need for transfusion, pain, inability to adequately break up stone, inability to pass stone fragments, Steinstrasse, infection associated with obstructing stones, need for alternate surgical procedure, need for repeat shockwave lithotripsy, MI, CVA, PE and the inherent risks with anesthesia/conscious sedation.   For PCNL I described the risks including positioning injury, pneumothorax, hydrothorax, need for chest tube, inability to clear stone burden, renal laceration, arterial venous fistula or malformation, need for embolization of kidney, loss of kidney or renal function, need for repeat procedure, need for prolonged nephrostomy tube, ureteral avulsion, MI, CVA, PE and the inherent risks of general anesthesia.   - The patient would like to proceed with left ureteroscopic  stone extraction.    No follow-ups on file.  Nicolette Bang, MD  [facility] [place]

## 2021-06-26 ENCOUNTER — Encounter (HOSPITAL_BASED_OUTPATIENT_CLINIC_OR_DEPARTMENT_OTHER): Payer: Self-pay | Admitting: Nurse Practitioner

## 2021-06-26 LAB — COMPREHENSIVE METABOLIC PANEL
ALT: 43 IU/L (ref 0–44)
AST: 33 IU/L (ref 0–40)
Albumin/Globulin Ratio: 2.1 (ref 1.2–2.2)
Albumin: 4.9 g/dL (ref 4.0–5.0)
Alkaline Phosphatase: 106 IU/L (ref 44–121)
BUN/Creatinine Ratio: 19 (ref 9–20)
BUN: 18 mg/dL (ref 6–24)
Bilirubin Total: 0.5 mg/dL (ref 0.0–1.2)
CO2: 22 mmol/L (ref 20–29)
Calcium: 9.6 mg/dL (ref 8.7–10.2)
Chloride: 105 mmol/L (ref 96–106)
Creatinine, Ser: 0.93 mg/dL (ref 0.76–1.27)
Globulin, Total: 2.3 g/dL (ref 1.5–4.5)
Glucose: 101 mg/dL — ABNORMAL HIGH (ref 70–99)
Potassium: 4.4 mmol/L (ref 3.5–5.2)
Sodium: 144 mmol/L (ref 134–144)
Total Protein: 7.2 g/dL (ref 6.0–8.5)
eGFR: 103 mL/min/{1.73_m2} (ref >59)

## 2021-06-26 LAB — CBC WITH DIFFERENTIAL/PLATELET
Basophils Absolute: 0.1 10*3/uL (ref 0.0–0.2)
Basos: 1 %
EOS (ABSOLUTE): 0.1 10*3/uL (ref 0.0–0.4)
Eos: 1 %
Hematocrit: 47 % (ref 37.5–51.0)
Hemoglobin: 15.7 g/dL (ref 13.0–17.7)
Immature Grans (Abs): 0 10*3/uL (ref 0.0–0.1)
Immature Granulocytes: 0 %
Lymphocytes Absolute: 1.9 10*3/uL (ref 0.7–3.1)
Lymphs: 19 %
MCH: 30.4 pg (ref 26.6–33.0)
MCHC: 33.4 g/dL (ref 31.5–35.7)
MCV: 91 fL (ref 79–97)
Monocytes Absolute: 0.5 10*3/uL (ref 0.1–0.9)
Monocytes: 5 %
Neutrophils Absolute: 7.4 10*3/uL — ABNORMAL HIGH (ref 1.4–7.0)
Neutrophils: 74 %
Platelets: 186 10*3/uL (ref 150–450)
RBC: 5.17 x10E6/uL (ref 4.14–5.80)
RDW: 12.7 % (ref 11.6–15.4)
WBC: 10.1 10*3/uL (ref 3.4–10.8)

## 2021-06-26 LAB — CREATININE KINASE MB: CK-MB Index: 1.5 ng/mL (ref 0.0–10.4)

## 2021-06-27 LAB — URINE CULTURE: Organism ID, Bacteria: NO GROWTH

## 2021-07-01 ENCOUNTER — Encounter (HOSPITAL_COMMUNITY): Payer: Self-pay

## 2021-07-01 ENCOUNTER — Encounter (HOSPITAL_COMMUNITY)
Admission: RE | Admit: 2021-07-01 | Discharge: 2021-07-01 | Disposition: A | Discharge status: Home / Self Care | Payer: Commercial Managed Care - PPO | Source: Ambulatory Visit | Attending: Urology | Admitting: Urology

## 2021-07-02 ENCOUNTER — Other Ambulatory Visit (HOSPITAL_BASED_OUTPATIENT_CLINIC_OR_DEPARTMENT_OTHER): Payer: Self-pay

## 2021-07-02 ENCOUNTER — Encounter (HOSPITAL_BASED_OUTPATIENT_CLINIC_OR_DEPARTMENT_OTHER): Payer: Self-pay | Admitting: Nurse Practitioner

## 2021-07-02 DIAGNOSIS — E782 Mixed hyperlipidemia: Secondary | ICD-10-CM

## 2021-07-02 MED ORDER — ATORVASTATIN CALCIUM 20 MG PO TABS: 20.0000 mg | ORAL_TABLET | Freq: Every day | ORAL | 0 refills | Status: DC

## 2021-07-03 ENCOUNTER — Ambulatory Visit (HOSPITAL_BASED_OUTPATIENT_CLINIC_OR_DEPARTMENT_OTHER): Payer: Commercial Managed Care - PPO | Admitting: Certified Registered Nurse Anesthetist

## 2021-07-03 ENCOUNTER — Ambulatory Visit (HOSPITAL_COMMUNITY): Payer: Commercial Managed Care - PPO

## 2021-07-03 ENCOUNTER — Encounter (HOSPITAL_COMMUNITY): Payer: Self-pay | Admitting: Urology

## 2021-07-03 ENCOUNTER — Ambulatory Visit (HOSPITAL_COMMUNITY)
Admission: RE | Admit: 2021-07-03 | Discharge: 2021-07-03 | Disposition: A | Discharge status: Home / Self Care | Payer: Commercial Managed Care - PPO | Attending: Urology | Admitting: Urology

## 2021-07-03 ENCOUNTER — Encounter (HOSPITAL_COMMUNITY)
Admission: RE | Disposition: A | Discharge status: Home / Self Care | Payer: Self-pay | Source: Home / Self Care | Attending: Urology

## 2021-07-03 ENCOUNTER — Ambulatory Visit (HOSPITAL_COMMUNITY): Payer: Commercial Managed Care - PPO | Admitting: Certified Registered Nurse Anesthetist

## 2021-07-03 DIAGNOSIS — N201 Calculus of ureter: Secondary | ICD-10-CM

## 2021-07-03 DIAGNOSIS — Z79899 Other long term (current) drug therapy: Secondary | ICD-10-CM | POA: Diagnosis not present

## 2021-07-03 DIAGNOSIS — E039 Hypothyroidism, unspecified: Secondary | ICD-10-CM | POA: Insufficient documentation

## 2021-07-03 DIAGNOSIS — N1330 Unspecified hydronephrosis: Secondary | ICD-10-CM | POA: Diagnosis not present

## 2021-07-03 HISTORY — PX: CYSTOSCOPY WITH RETROGRADE PYELOGRAM, URETEROSCOPY AND STENT PLACEMENT: SHX5789

## 2021-07-03 HISTORY — PX: STONE EXTRACTION WITH BASKET: SHX5318

## 2021-07-03 SURGERY — CYSTOURETEROSCOPY, WITH RETROGRADE PYELOGRAM AND STENT INSERTION
Anesthesia: General | Site: Ureter | Laterality: Left

## 2021-07-03 MED ORDER — OXYCODONE-ACETAMINOPHEN 5-325 MG PO TABS: 1.0000 | ORAL_TABLET | ORAL | 0 refills | Status: DC | PRN

## 2021-07-03 MED ORDER — DIATRIZOATE MEGLUMINE 30 % UR SOLN
URETHRAL | Status: AC
  Filled 2021-07-03: qty 100

## 2021-07-03 MED ORDER — PROPOFOL 10 MG/ML IV BOLUS
INTRAVENOUS | Status: AC
  Filled 2021-07-03: qty 20

## 2021-07-03 MED ORDER — LIDOCAINE HCL (CARDIAC) PF 100 MG/5ML IV SOSY
PREFILLED_SYRINGE | INTRAVENOUS | Status: DC | PRN
  Administered 2021-07-03: 60 mg via INTRAVENOUS

## 2021-07-03 MED ORDER — ONDANSETRON HCL 4 MG/2ML IJ SOLN
INTRAMUSCULAR | Status: DC | PRN
  Administered 2021-07-03: 4 mg via INTRAVENOUS

## 2021-07-03 MED ORDER — LACTATED RINGERS IV SOLN: INTRAVENOUS | Status: DC | PRN

## 2021-07-03 MED ORDER — FENTANYL CITRATE (PF) 100 MCG/2ML IJ SOLN
INTRAMUSCULAR | Status: AC
  Filled 2021-07-03: qty 2

## 2021-07-03 MED ORDER — MIDAZOLAM HCL 2 MG/2ML IJ SOLN
INTRAMUSCULAR | Status: AC
  Filled 2021-07-03: qty 2

## 2021-07-03 MED ORDER — DIATRIZOATE MEGLUMINE 30 % UR SOLN
URETHRAL | Status: DC | PRN
  Administered 2021-07-03: 4 mL via URETHRAL

## 2021-07-03 MED ORDER — FENTANYL CITRATE (PF) 250 MCG/5ML IJ SOLN
INTRAMUSCULAR | Status: DC | PRN
Start: 2021-07-03 — End: 2021-07-03
  Administered 2021-07-03: 25 ug via INTRAVENOUS

## 2021-07-03 MED ORDER — ONDANSETRON HCL 4 MG/2ML IJ SOLN
4.0000 mg | Freq: Once | INTRAMUSCULAR | Status: DC | PRN
Start: 2021-07-03 — End: 2021-07-03

## 2021-07-03 MED ORDER — MIDAZOLAM HCL 2 MG/2ML IJ SOLN
INTRAMUSCULAR | Status: DC | PRN
  Administered 2021-07-03: 2 mg via INTRAVENOUS

## 2021-07-03 MED ORDER — PROPOFOL 10 MG/ML IV BOLUS
INTRAVENOUS | Status: DC | PRN
  Administered 2021-07-03: 200 mg via INTRAVENOUS

## 2021-07-03 MED ORDER — CEFAZOLIN SODIUM-DEXTROSE 2-4 GM/100ML-% IV SOLN
2.0000 g | INTRAVENOUS | Status: AC
Start: 2021-07-03 — End: 2021-07-03
  Administered 2021-07-03: 2 g via INTRAVENOUS
  Filled 2021-07-03: qty 100

## 2021-07-03 MED ORDER — SODIUM CHLORIDE 0.9 % IR SOLN
Status: DC | PRN
  Administered 2021-07-03 (×2): 3000 mL

## 2021-07-03 MED ORDER — WATER FOR IRRIGATION, STERILE IR SOLN
Status: DC | PRN
  Administered 2021-07-03: 500 mL via CUTANEOUS

## 2021-07-03 MED ORDER — LIDOCAINE HCL (PF) 2 % IJ SOLN
INTRAMUSCULAR | Status: AC
  Filled 2021-07-03: qty 5

## 2021-07-03 MED ORDER — FENTANYL CITRATE PF 50 MCG/ML IJ SOSY: 25.0000 ug | PREFILLED_SYRINGE | INTRAMUSCULAR | Status: DC | PRN

## 2021-07-03 SURGICAL SUPPLY — 25 items
BAG DRAIN URO TABLE W/ADPT NS (BAG) ×3 IMPLANT
BAG DRN 8 ADPR NS SKTRN CSTL (BAG) ×2
BAG HAMPER (MISCELLANEOUS) ×3 IMPLANT
CATH INTERMIT  6FR 70CM (CATHETERS) ×3 IMPLANT
CLOTH BEACON ORANGE TIMEOUT ST (SAFETY) ×3 IMPLANT
EXTRACTOR STONE NITINOL NGAGE (UROLOGICAL SUPPLIES) ×2 IMPLANT
GLOVE BIO SURGEON STRL SZ8 (GLOVE) ×3 IMPLANT
GLOVE BIOGEL PI IND STRL 7.0 (GLOVE) ×4 IMPLANT
GLOVE BIOGEL PI INDICATOR 7.0 (GLOVE) ×2
GOWN STRL REUS W/TWL LRG LVL3 (GOWN DISPOSABLE) ×3 IMPLANT
GOWN STRL REUS W/TWL XL LVL3 (GOWN DISPOSABLE) ×3 IMPLANT
GUIDEWIRE STR DUAL SENSOR (WIRE) ×3 IMPLANT
GUIDEWIRE STR ZIPWIRE 035X150 (MISCELLANEOUS) ×3 IMPLANT
IV NS IRRIG 3000ML ARTHROMATIC (IV SOLUTION) ×6 IMPLANT
KIT TURNOVER CYSTO (KITS) ×3 IMPLANT
MANIFOLD NEPTUNE II (INSTRUMENTS) ×3 IMPLANT
PACK CYSTO (CUSTOM PROCEDURE TRAY) ×3 IMPLANT
PAD ARMBOARD 7.5X6 YLW CONV (MISCELLANEOUS) ×3 IMPLANT
STENT URET 6FRX26 CONTOUR (STENTS) ×2 IMPLANT
SYR 10ML LL (SYRINGE) ×3 IMPLANT
SYR CONTROL 10ML LL (SYRINGE) ×3 IMPLANT
TOWEL OR 17X26 4PK STRL BLUE (TOWEL DISPOSABLE) ×3 IMPLANT
TRACTIP FLEXIVA PULS ID 200XHI (Laser) IMPLANT
TRACTIP FLEXIVA PULSE ID 200 (Laser)
WATER STERILE IRR 500ML POUR (IV SOLUTION) ×3 IMPLANT

## 2021-07-03 NOTE — Interval H&P Note (Signed)
History and Physical Interval Note:  07/03/2021 7:23 AM  Howard Alexander  has presented today for surgery, with the diagnosis of left ureteral calculus.  The various methods of treatment have been discussed with the patient and family. After consideration of risks, benefits and other options for treatment, the patient has consented to  Procedure(s) with comments: CYSTOSCOPY WITH RETROGRADE PYELOGRAM, URETEROSCOPY AND STENT PLACEMENT (Left) - pt knows to arrive at 6:15 HOLMIUM LASER APPLICATION (Left) as a surgical intervention.  The patient's history has been reviewed, patient examined, no change in status, stable for surgery.  I have reviewed the patient's chart and labs.  Questions were answered to the patient's satisfaction.     Wilkie Aye

## 2021-07-03 NOTE — Anesthesia Preprocedure Evaluation (Signed)
Anesthesia Evaluation  Patient identified by MRN, date of birth, ID band Patient awake    Reviewed: Allergy & Precautions, H&P , NPO status , Patient's Chart, lab work & pertinent test results, reviewed documented beta blocker date and time   Airway Mallampati: II  TM Distance: >3 FB Neck ROM: full    Dental no notable dental hx.    Pulmonary neg pulmonary ROS,    Pulmonary exam normal breath sounds clear to auscultation       Cardiovascular Exercise Tolerance: Good negative cardio ROS   Rhythm:regular Rate:Normal     Neuro/Psych negative neurological ROS  negative psych ROS   GI/Hepatic negative GI ROS, Neg liver ROS,   Endo/Other  Hypothyroidism   Renal/GU negative Renal ROS  negative genitourinary   Musculoskeletal   Abdominal   Peds  Hematology negative hematology ROS (+)   Anesthesia Other Findings   Reproductive/Obstetrics negative OB ROS                             Anesthesia Physical Anesthesia Plan  ASA: 2  Anesthesia Plan: General and General LMA   Post-op Pain Management:    Induction:   PONV Risk Score and Plan: Ondansetron  Airway Management Planned:   Additional Equipment:   Intra-op Plan:   Post-operative Plan:   Informed Consent: I have reviewed the patients History and Physical, chart, labs and discussed the procedure including the risks, benefits and alternatives for the proposed anesthesia with the patient or authorized representative who has indicated his/her understanding and acceptance.     Dental Advisory Given  Plan Discussed with: CRNA  Anesthesia Plan Comments:         Anesthesia Quick Evaluation

## 2021-07-03 NOTE — Transfer of Care (Signed)
Immediate Anesthesia Transfer of Care Note  Patient: Howard Alexander  Procedure(s) Performed: CYSTOSCOPY WITH RETROGRADE PYELOGRAM, URETEROSCOPY AND STENT PLACEMENT (Left: Ureter)  Patient Location: PACU  Anesthesia Type:General  Level of Consciousness: drowsy  Airway & Oxygen Therapy: Patient Spontanous Breathing and Patient connected to nasal cannula oxygen  Post-op Assessment: Report given to RN and Post -op Vital signs reviewed and stable  Post vital signs: Reviewed and stable  Last Vitals:  Vitals Value Taken Time  BP 110/77   Temp    Pulse 84 07/03/21 0810  Resp    SpO2 98 % 07/03/21 0810  Vitals shown include unvalidated device data.  Last Pain:  Vitals:   07/03/21 0706  TempSrc: Oral  PainSc: 0-No pain      Patients Stated Pain Goal: 7 ([id number])  Complications: No notable events documented.

## 2021-07-03 NOTE — Anesthesia Postprocedure Evaluation (Signed)
Anesthesia Post Note  Patient: Howard Alexander  Procedure(s) Performed: CYSTOSCOPY WITH RETROGRADE PYELOGRAM, URETEROSCOPY AND STENT PLACEMENT (Left: Ureter)  Patient location during evaluation: Phase II Anesthesia Type: General Level of consciousness: awake Pain management: pain level controlled Vital Signs Assessment: post-procedure vital signs reviewed and stable Respiratory status: spontaneous breathing and respiratory function stable Cardiovascular status: blood pressure returned to baseline and stable Postop Assessment: no headache and no apparent nausea or vomiting Anesthetic complications: no Comments: Late entry   No notable events documented.   Last Vitals:  Vitals:   07/03/21 0830 07/03/21 0840  BP: (!) 118/91 116/82  Pulse: 81 75  Resp: 12 16  Temp:  36.5 C  SpO2: 95% 97%    Last Pain:  Vitals:   07/03/21 0840  TempSrc: Oral  PainSc: 0-No pain                 Windell Norfolk

## 2021-07-03 NOTE — Anesthesia Procedure Notes (Signed)
Procedure Name: LMA Insertion Date/Time: 07/03/2021 7:37 AM  Performed by: Lorin Glass, CRNAPre-anesthesia Checklist: Emergency Drugs available, Patient identified, Suction available and Patient being monitored Patient Re-evaluated:Patient Re-evaluated prior to induction Oxygen Delivery Method: Circle system utilized Preoxygenation: Pre-oxygenation with 100% oxygen Induction Type: IV induction LMA: LMA inserted LMA Size: 4.0 Number of attempts: 1 Placement Confirmation: positive ETCO2 and breath sounds checked- equal and bilateral Tube secured with: Tape Dental Injury: Teeth and Oropharynx as per pre-operative assessment

## 2021-07-03 NOTE — Op Note (Signed)
.  Preoperative diagnosis: Left ureteral stone  Postoperative diagnosis: Same  Procedure: 1 cystoscopy 2. Left retrograde pyelography 3.  Intraoperative fluoroscopy, under one hour, with interpretation 4.  Left ureteroscopic stone manipulation with basket extraction 5.  Left 6 x 26 JJ stent placement  Attending: Cleda Mccreedy  Anesthesia: General  Estimated blood loss: None  Drains: Left 6 x 26 JJ ureteral stent with tether  Specimens: left ureteral calculus  Antibiotics: ancef  Findings: left distal ureteral stone. Moderate hydronephrosis. No masses/lesions in the bladder. Ureteral orifices in normal anatomic location.  Indications: Patient is a 46 year old male with a history of left ureteral stone who has failed medical expulsive therapy.  After discussing treatment options, he decided proceed with left ureteroscopic stone manipulation.  Procedure in detail: The patient was brought to the operating room and a brief timeout was done to ensure correct patient, correct procedure, correct site.  General anesthesia was administered patient was placed in dorsal lithotomy position.  Her genitalia was then prepped and draped in usual sterile fashion.  A rigid 22 French cystoscope was passed in the urethra and the bladder.  Bladder was inspected free masses or lesions.  the ureteral orifices were in the normal orthotopic locations.  a 6 french ureteral catheter was then instilled into the left ureteral orifice.  a gentle retrograde was obtained and findings noted above.  we then placed a zip wire through the ureteral catheter and advanced up to the renal pelvis.  we then removed the cystoscope and cannulated the left ureteral orifice with a semirigid ureteroscope.  We encountered a stone in the distal ureter which was removed with an NGage basket. We then repeated ureteroscopy to the UPJ and noted no additional calculi. We removed the scope and then we placed a 6 x 26 double-j ureteral stent  over the original zip wire. We removed the wire and good coil was noted in the the renal pelvis under fluoroscopy and the bladder under direct vision.     the bladder was then drained and this concluded the procedure which was well tolerated by patient.  Complications: None  Condition: Stable, extubated, transferred to PACU  Plan: Patient is to be discharged home as to follow-up in one week. He is to remove his stent in 72 hours by puling the tether

## 2021-07-04 ENCOUNTER — Encounter (HOSPITAL_COMMUNITY): Payer: Self-pay | Admitting: Urology

## 2021-07-09 ENCOUNTER — Ambulatory Visit (INDEPENDENT_AMBULATORY_CARE_PROVIDER_SITE_OTHER): Payer: Commercial Managed Care - PPO | Admitting: Physician Assistant

## 2021-07-09 VITALS — BP 117/79 | HR 89 | Ht 69.0 in | Wt 217.0 lb

## 2021-07-09 DIAGNOSIS — N200 Calculus of kidney: Secondary | ICD-10-CM | POA: Diagnosis not present

## 2021-07-09 LAB — URINALYSIS, ROUTINE W REFLEX MICROSCOPIC
Bilirubin, UA: NEGATIVE
Glucose, UA: NEGATIVE
Ketones, UA: NEGATIVE
Leukocytes,UA: NEGATIVE
Nitrite, UA: NEGATIVE
Protein,UA: NEGATIVE
Specific Gravity, UA: <1.005 — ABNORMAL LOW (ref 1.005–1.030)
Urobilinogen, Ur: 0.2 mg/dL (ref 0.2–1.0)
pH, UA: 6 (ref 5.0–7.5)

## 2021-07-09 LAB — MICROSCOPIC EXAMINATION: Bacteria, UA: NONE SEEN

## 2021-07-09 NOTE — Progress Notes (Signed)
Assessment: 1. Nephrolithiasis - Urinalysis, Routine w reflex microscopic - Ultrasound renal complete; Future    Plan: Stone prevention diet discussed and he will continue to increase fluid intake avoiding sodas and tea. FU in 6 weeks after renal US for recheck and UA  Chief Complaint: No chief complaint on file.   HPI: Howard Alexander is a 46 y.o. male who presents for continued evaluation of left-sided ureteral stone.  The patient is status post stone manipulation/extraction and JJ stent placement on 07/03/2021. He states he is doing well and had no difficulty pulling his stent. He denies pain, gross hematuria, fever, nausea. Minimal LUTS . Urine clear today   Portions of the above documentation were copied from a prior visit for review purposes only.  Allergies: No Known Allergies  PMH: Past Medical History:  Diagnosis Date   Allergy    Seasonal/pollen   Asthma    Hypercholesterolemia    Hypothyroidism due to Hashimoto's thyroiditis 03/31/2021    PSH: Past Surgical History:  Procedure Laterality Date   CYSTOSCOPY WITH RETROGRADE PYELOGRAM, URETEROSCOPY AND STENT PLACEMENT Left 07/03/2021   Procedure: CYSTOSCOPY WITH RETROGRADE PYELOGRAM, URETEROSCOPY AND STENT PLACEMENT;  Surgeon: Malen Gauze, MD;  Location: [facility];  Service: Urology;  Laterality: Left;   STONE EXTRACTION WITH BASKET Left 07/03/2021   Procedure: STONE EXTRACTION WITH BASKET;  Surgeon: Malen Gauze, MD;  Location: [facility];  Service: Urology;  Laterality: Left;    SH: Social History   Tobacco Use   Smoking status: Never    Passive exposure: Never   Smokeless tobacco: Never   Tobacco comments:    Never started  Vaping Use   Vaping Use: Never used  Substance Use Topics   Alcohol use: Not Currently    Alcohol/week: 1.0 standard drink of alcohol    Types: 1 Standard drinks or equivalent per week    Comment: Social only   Drug use: Never    ROS: See HPI  PE: BP 117/79   Pulse 89    Ht 5\' 9"  (1.753 m)   Wt 217 lb (98.4 kg)   BMI 32.05 kg/m  GENERAL APPEARANCE:  Well appearing, well developed, well nourished, NAD HEENT:  Atraumatic, normocephalic NECK:  Supple. Trachea midline ABDOMEN:  Soft, non-tender, no masses EXTREMITIES:  Moves all extremities well, without clubbing, cyanosis, or edema NEUROLOGIC:  Alert and oriented x 3, normal gait, CN II-XII grossly intact MENTAL STATUS:  appropriate BACK:  Non-tender to palpation, No CVAT SKIN:  Warm, dry, and intact   Results: Laboratory Data: Lab Results  Component Value Date   WBC 10.1 06/25/2021   HGB 15.7 06/25/2021   HCT 47.0 06/25/2021   MCV 91 06/25/2021   PLT 186 06/25/2021    Lab Results  Component Value Date   CREATININE 0.93 06/25/2021    No results found for: "PSA"  No results found for: "TESTOSTERONE"  Lab Results  Component Value Date   HGBA1C 5.7 (H) 03/31/2021    Urinalysis    Component Value Date/Time   COLORURINE YELLOW 10/14/2020 1401   APPEARANCEUR Clear 07/09/2021 1405   LABSPEC 1.040 (H) 10/14/2020 1401   PHURINE 6.0 10/14/2020 1401   GLUCOSEU Negative 07/09/2021 1405   HGBUR NEGATIVE 10/14/2020 1401   BILIRUBINUR Negative 07/09/2021 1405   KETONESUR NEGATIVE 10/14/2020 1401   PROTEINUR Negative 07/09/2021 1405   PROTEINUR 30 (A) 10/14/2020 1401   UROBILINOGEN 0.2 05/29/2021 0856   NITRITE Negative 07/09/2021 1405   NITRITE NEGATIVE 10/14/2020 1401  LEUKOCYTESUR Negative 07/09/2021 1405   LEUKOCYTESUR NEGATIVE 10/14/2020 1401    Lab Results  Component Value Date   LABMICR See below: 07/09/2021   WBCUA 0-5 07/09/2021   LABEPIT 0-10 07/09/2021   MUCUS Present 06/25/2021   BACTERIA None seen 07/09/2021    Pertinent Imaging:  Results for orders placed in visit on 06/25/21  Abdomen 1 view (KUB)  Narrative CLINICAL DATA:  Flat plate abdomen follow-up for kidney stones.  EXAM: ABDOMEN - 1 VIEW  COMPARISON:  CT abdomen and pelvis with contrast  10/14/2020  FINDINGS: The bowel gas pattern is normal. There is least 1 punctate nonobstructive caliceal stone in the inferior pole of the left kidney, which was seen previously.  In the left hemipelvis at the level of the ischial spines, there is new demonstration of a rod-like density or calcification measuring 6 mm in length and 2.5 mm width.  This is concerning for a UVJ stone and no similar abnormality was seen on CT. There were no phleboliths in the pelvis.  Alternatively this could be a superimposed calcification in the soft tissues.  The visceral shadows are stable as visualized, with exclusion of the domes of the diaphragm from the study.  No acute osseous abnormality is seen.  IMPRESSION: 1. New demonstration of a rod-like 6 x 2.5 mm density or calcification in the left hemipelvis in the plane of the UVJ. This is concerning for a distal ureteral or UVJ calculus. Further imaging could include CT if needed for confirmation. 2. At least 1 punctate caliceal stone appears present in the lower pole left kidney. No other nephrolithiasis is visible radiographically. 3. These results will be called to the ordering clinician or representative by the radiology assistant, and communication documented in the PACS or [facility] dashboard.   Electronically Signed By: Almira Bar M.D. On: 06/26/2021 20:24  No results found for this or any previous visit.  No results found for this or any previous visit.  No results found for this or any previous visit.  No results found for this or any previous visit.  No results found for this or any previous visit.  No results found for this or any previous visit.  No results found for this or any previous visit.  Results for orders placed or performed in visit on 07/10/21 (from the past 24 hour(s))  CK Total (and CKMB)   Collection Time: 07/10/21 11:30 AM  Result Value Ref Range   Total CK 109 49 - 439 U/L   CK-MB Index 1.6 0.0 -  10.4 ng/mL

## 2021-07-10 ENCOUNTER — Encounter (HOSPITAL_BASED_OUTPATIENT_CLINIC_OR_DEPARTMENT_OTHER): Payer: Self-pay | Admitting: Nurse Practitioner

## 2021-07-10 ENCOUNTER — Ambulatory Visit (INDEPENDENT_AMBULATORY_CARE_PROVIDER_SITE_OTHER): Payer: Commercial Managed Care - PPO | Admitting: Nurse Practitioner

## 2021-07-10 VITALS — BP 120/86 | HR 86 | Ht 69.0 in | Wt 221.0 lb

## 2021-07-10 DIAGNOSIS — M6282 Rhabdomyolysis: Secondary | ICD-10-CM

## 2021-07-10 DIAGNOSIS — N200 Calculus of kidney: Secondary | ICD-10-CM

## 2021-07-10 DIAGNOSIS — J0140 Acute pansinusitis, unspecified: Secondary | ICD-10-CM | POA: Diagnosis not present

## 2021-07-10 DIAGNOSIS — E782 Mixed hyperlipidemia: Secondary | ICD-10-CM | POA: Diagnosis not present

## 2021-07-10 MED ORDER — ATORVASTATIN CALCIUM 20 MG PO TABS: 20.0000 mg | ORAL_TABLET | Freq: Every day | ORAL | 3 refills | Status: DC

## 2021-07-10 MED ORDER — ALBUTEROL SULFATE HFA 108 (90 BASE) MCG/ACT IN AERS: 1.0000 | INHALATION_SPRAY | Freq: Four times a day (QID) | RESPIRATORY_TRACT | 6 refills | Status: DC | PRN

## 2021-07-10 MED ORDER — AZITHROMYCIN 250 MG PO TABS: ORAL_TABLET | ORAL | 0 refills | Status: DC

## 2021-07-10 NOTE — Patient Instructions (Signed)
It was a pleasure seeing you today. I hope your time spent with [place] was pleasant and helpful. Please let us know if there is anything we can do to improve the service you receive.   Today we discussed concerns with:  Acute non-recurrent pansinusitis I have sent in the azithromycin to the pharmacy. Let me know if this does not improve.   Non-traumatic rhabdomyolysis We will check the CK again today and make sure it has come down to normal I am going to do some more research and see what I can find out about the possibly genetic connection  Left nephrolithiasis Continue to follow with urology as directed. Please let me know if you have any additional issues     Important Office Information Lab Results If labs were ordered, please note that you will see results through MyChart as soon as they come available from [facility].  It takes up to 5 business days for the results to be routed to me and for me to review them once all of the lab results have come through from [facility]. I will make recommendations based on your results and send these through MyChart or someone from the office will call you to discuss. If your labs are abnormal, we may contact you to schedule a visit to discuss the results and make recommendations.  If you have not heard from [place] within 5 business days or you have waited longer than a week and your lab results have not come through on MyChart, please feel free to call the office or send a message through MyChart to follow-up on these labs.   Referrals If referrals were placed today, the office where the referral was sent will contact you either by phone or through MyChart to set up scheduling. Please note that it can take up to a week for the referral office to contact you. If you do not hear from them in a week, please contact the referral office directly to inquire about scheduling.   Condition Treated If your condition worsens or you begin to have new symptoms, please  schedule a follow-up appointment for further evaluation. If you are not sure if an appointment is needed, you may call the office to leave a message for the nurse and someone will contact you with recommendations.  If you have an urgent or life threatening emergency, please do not call the office, but seek emergency evaluation by calling 911 or going to the nearest emergency room for evaluation.   MyChart and Phone Calls Please do not use MyChart for urgent messages. It may take up to 3 business days for MyChart messages to be read by staff and if they are unable to handle the request, an additional 3 business days for them to be routed to me and for my response.  Messages sent to the provider through MyChart do not come directly to the provider, please allow time for these messages to be routed and for me to respond.  We get a large volume of MyChart messages daily and these are responded to in the order received.   For urgent messages, please call the office at [phone] and speak with the front office staff or leave a message on the line of my assistant for guidance.  We are seeing patients from the hours of 8:00 am through 5:00 pm and calls directly to the nurse may not be answered immediately due to seeing patients, but your call will be returned as soon as possible.  Phone  messages received after 4:00 PM Monday through Thursday may not be returned until the following business day. Phone messages received after 11:00 AM on Friday may not be returned until Monday.   After Hours We share on call hours with providers from other offices. If you have an urgent need after hours that cannot wait until the next business day, please contact the on call provider by calling the office number. A nurse will speak with you and contact the provider if needed for recommendations.  If you have an urgent or life threatening emergency after hours, please do not call the on call provider, but seek emergency  evaluation by calling 911 or going to the nearest emergency room for evaluation.   Paperwork All paperwork requires a minimum of 5 days to complete and return to you or the designated personnel. Please keep this in mind when bringing in forms or sending requests for paperwork completion to the office.

## 2021-07-10 NOTE — Progress Notes (Signed)
Shawna Clamp, DNP, AGNP-c [facility] [address] [place], [place] [address] [phone] Office [phone] Fax  ESTABLISHED PATIENT- Chronic Health and/or Follow-Up Visit  Blood pressure 120/86, pulse 86, height 5\' 9"  (1.753 m), weight 221 lb (100.2 kg), SpO2 98 %.  Follow-up   HPI  Howard Alexander  is a 46 y.o. year old male presenting today for evaluation and management of the following:  Hospital follow-up Stevon was vacationing with his family in [place] when he began to develop lower left abdominal pain.  After presenting to the emergency room in [place] it was determined that he had a kidney stone.  He was encouraged to drink water and was released without admission.  Shortly after his release he reports that he was called back to the hospital to come back for additional blood work.  He was told that the CK levels were significantly elevated and they admitted him overnight for observation.  He was diagnosed at that time with rhabdomyolysis he was IV hydrated throughout the night and released the following day and told to follow-up with his PCP outpatient  At this time he tells me that he feels fine.  He reports that aside from the kidney stone pain he was not exhibiting any additional symptoms during the hospitalization.  He denies any falls or prolonged sedentary episodes prior to the hospitalization.  He denies any cardiac symptoms or injuries.  He did follow-up with urology outpatient once he got back to [facility].  It was found that the stone was still present and he underwent a cystogram for removal last week.  He followed up with urology yesterday and was told everything is healing well  Additionally he reports onset of congestion approximately 5 days ago.  He endorses rhinorrhea otalgia and facial P/P he also endorses a sore throat.  He denies headaches, shortness of breath, cough, weakness, fevers, chills  ROS All ROS  negative with exception of what is listed in HPI  PHYSICAL EXAM Physical Exam Vitals and nursing note reviewed.  Constitutional:      General: He is not in acute distress.    Appearance: Normal appearance. He is not ill-appearing.  HENT:     Head: Normocephalic.     Nose: Congestion and rhinorrhea present.     Right Sinus: Maxillary sinus tenderness and frontal sinus tenderness present.     Left Sinus: Maxillary sinus tenderness and frontal sinus tenderness present.     Mouth/Throat:     Pharynx: Posterior oropharyngeal erythema present.  Eyes:     Extraocular Movements: Extraocular movements intact.     Pupils: Pupils are equal, round, and reactive to light.  Neck:     Vascular: No carotid bruit.  Cardiovascular:     Rate and Rhythm: Normal rate and regular rhythm.     Pulses: Normal pulses.     Heart sounds: Normal heart sounds.  Pulmonary:     Effort: Pulmonary effort is normal.     Breath sounds: Normal breath sounds.  Abdominal:     General: Abdomen is flat. Bowel sounds are normal. There is no distension.     Palpations: Abdomen is soft.     Tenderness: There is no abdominal tenderness. There is no right CVA tenderness, left CVA tenderness or guarding.  Musculoskeletal:     Cervical back: Normal range of motion.     Right lower leg: No edema.     Left lower leg: No edema.  Lymphadenopathy:  Cervical: Cervical adenopathy present.  Skin:    General: Skin is warm and dry.     Capillary Refill: Capillary refill takes less than 2 seconds.  Neurological:     General: No focal deficit present.     Mental Status: He is alert and oriented to person, place, and time.  Psychiatric:        Mood and Affect: Mood normal.        Behavior: Behavior normal.        Thought Content: Thought content normal.     ASSESSMENT & PLAN Problem List Items Addressed This Visit     Mixed hyperlipidemia    Medication refill provided today.      Relevant Medications   atorvastatin  (LIPITOR) 20 MG tablet   Acute non-recurrent pansinusitis - Primary    Symptoms and presentation consistent with sinusitis.  Most likely a bacterial etiology given the length of time symptoms of been present.  We will send antibiotic treatment for patient and recommend conservative management with Mucinex, Tylenol, ibuprofen, warm salt water gargles, and increased rest.  He will follow-up if symptoms worsen or fail to improve.      Relevant Medications   azithromycin (ZITHROMAX) 250 MG tablet   albuterol (VENTOLIN HFA) 108 (90 Base) MCG/ACT inhaler   Non-traumatic rhabdomyolysis    Rhabdomyolysis with recent hospitalization.  No trauma or injury noted prior to findings.  He did have a kidney stone which has subsequently been removed.  We will recheck labs today to ensure that his CK has returned to normal.  He appears well today and denies any concerning symptoms.      Relevant Orders   CK Total (and CKMB) (Completed)   Left nephrolithiasis    Recent cystogram for removal.  Patient reports feeling well with no recurrent symptoms.        FOLLOW-UP Return if symptoms worsen or fail to improve.   Shawna Clamp, DNP, AGNP-c 07/10/2021 10:37 AM

## 2021-07-11 LAB — CK TOTAL AND CKMB (NOT AT ARMC)
CK-MB Index: 1.6 ng/mL (ref 0.0–10.4)
Total CK: 109 U/L (ref 49–439)

## 2021-07-14 ENCOUNTER — Ambulatory Visit: Payer: Commercial Managed Care - PPO | Admitting: Urology

## 2021-07-24 ENCOUNTER — Encounter (HOSPITAL_BASED_OUTPATIENT_CLINIC_OR_DEPARTMENT_OTHER): Payer: Self-pay | Admitting: Nurse Practitioner

## 2021-07-24 DIAGNOSIS — N200 Calculus of kidney: Secondary | ICD-10-CM

## 2021-07-24 DIAGNOSIS — M6282 Rhabdomyolysis: Secondary | ICD-10-CM | POA: Insufficient documentation

## 2021-07-24 DIAGNOSIS — J0140 Acute pansinusitis, unspecified: Secondary | ICD-10-CM | POA: Insufficient documentation

## 2021-07-24 HISTORY — DX: Rhabdomyolysis: M62.82

## 2021-07-24 HISTORY — DX: Calculus of kidney: N20.0

## 2021-07-24 NOTE — Assessment & Plan Note (Signed)
Symptoms and presentation consistent with sinusitis.  Most likely a bacterial etiology given the length of time symptoms of been present.  We will send antibiotic treatment for patient and recommend conservative management with Mucinex, Tylenol, ibuprofen, warm salt water gargles, and increased rest.  He will follow-up if symptoms worsen or fail to improve.

## 2021-07-24 NOTE — Assessment & Plan Note (Signed)
Medication refill provided today.

## 2021-07-24 NOTE — Assessment & Plan Note (Signed)
Rhabdomyolysis with recent hospitalization.  No trauma or injury noted prior to findings.  He did have a kidney stone which has subsequently been removed.  We will recheck labs today to ensure that his CK has returned to normal.  He appears well today and denies any concerning symptoms.

## 2021-07-24 NOTE — Assessment & Plan Note (Signed)
Recent cystogram for removal.  Patient reports feeling well with no recurrent symptoms.

## 2021-08-13 ENCOUNTER — Ambulatory Visit (HOSPITAL_COMMUNITY)
Admission: RE | Admit: 2021-08-13 | Discharge: 2021-08-13 | Disposition: A | Discharge status: Home / Self Care | Payer: Commercial Managed Care - PPO | Source: Ambulatory Visit | Attending: Physician Assistant | Admitting: Physician Assistant

## 2021-08-13 DIAGNOSIS — N200 Calculus of kidney: Secondary | ICD-10-CM | POA: Insufficient documentation

## 2021-08-20 ENCOUNTER — Ambulatory Visit (INDEPENDENT_AMBULATORY_CARE_PROVIDER_SITE_OTHER): Payer: Commercial Managed Care - PPO | Admitting: Physician Assistant

## 2021-08-20 VITALS — BP 100/67 | HR 81

## 2021-08-20 DIAGNOSIS — N200 Calculus of kidney: Secondary | ICD-10-CM | POA: Diagnosis not present

## 2021-08-20 LAB — URINALYSIS, ROUTINE W REFLEX MICROSCOPIC
Bilirubin, UA: NEGATIVE
Glucose, UA: NEGATIVE
Ketones, UA: NEGATIVE
Leukocytes,UA: NEGATIVE
Nitrite, UA: NEGATIVE
Protein,UA: NEGATIVE
RBC, UA: NEGATIVE
Specific Gravity, UA: 1.02 (ref 1.005–1.030)
Urobilinogen, Ur: 1 mg/dL (ref 0.2–1.0)
pH, UA: 6.5 (ref 5.0–7.5)

## 2021-08-20 NOTE — Progress Notes (Signed)
Assessment: 1. Nephrolithiasis - Urinalysis, Routine w reflex microscopic    Plan: Discussed prevention, hydration, and FU with pt. Return in 6 months after renal US. Call or FU if any concerns or questions.  Chief Complaint: No chief complaint on file.   HPI: Howard Alexander is a 46 y.o. male who presents for continued evaluation of nephrolithiasis. No c/o since last visit. Pt is following stone prevention diet. No LUTS. Denies pain, gross hematuria. No stone fragments collected.  Renal US= no hydro/stones noted Renal fxn normal per last labs in May Urine clear  07/09/21 Howard Alexander is a 46 y.o. male who presents for continued evaluation of left-sided ureteral stone.  The patient is status post stone manipulation/extraction and JJ stent placement on 07/03/2021. He states he is doing well and had no difficulty pulling his stent. He denies pain, gross hematuria, fever, nausea. Minimal LUTS . Urine clear today  Portions of the above documentation were copied from a prior visit for review purposes only.  Allergies: No Known Allergies  PMH: Past Medical History:  Diagnosis Date   Allergy    Seasonal/pollen   Asthma    Hypercholesterolemia    Hypothyroidism due to Hashimoto's thyroiditis 03/31/2021   Left nephrolithiasis 07/24/2021   Non-traumatic rhabdomyolysis 07/24/2021    PSH: Past Surgical History:  Procedure Laterality Date   CYSTOSCOPY WITH RETROGRADE PYELOGRAM, URETEROSCOPY AND STENT PLACEMENT Left 07/03/2021   Procedure: CYSTOSCOPY WITH RETROGRADE PYELOGRAM, URETEROSCOPY AND STENT PLACEMENT;  Surgeon: Malen Gauze, MD;  Location: [facility];  Service: Urology;  Laterality: Left;   STONE EXTRACTION WITH BASKET Left 07/03/2021   Procedure: STONE EXTRACTION WITH BASKET;  Surgeon: Malen Gauze, MD;  Location: [facility];  Service: Urology;  Laterality: Left;    SH: Social History   Tobacco Use   Smoking status: Never    Passive exposure: Never   Smokeless  tobacco: Never   Tobacco comments:    Never started  Vaping Use   Vaping Use: Never used  Substance Use Topics   Alcohol use: Not Currently    Alcohol/week: 1.0 standard drink of alcohol    Types: 1 Standard drinks or equivalent per week    Comment: Social only   Drug use: Never    ROS: All other review of systems were reviewed and are negative except what is noted above in HPI  PE: BP 100/67   Pulse 81  GENERAL APPEARANCE:  Well appearing, well developed, NAD HEENT:  Atraumatic, normocephalic NECK:  Supple. Trachea midline NEUROLOGIC:  Alert and oriented x 3, normal gait, CN II-XII grossly intact MENTAL STATUS:  appropriate SKIN:  Warm, dry, and intact   Results: Laboratory Data: Lab Results  Component Value Date   WBC 10.1 06/25/2021   HGB 15.7 06/25/2021   HCT 47.0 06/25/2021   MCV 91 06/25/2021   PLT 186 06/25/2021    Lab Results  Component Value Date   CREATININE 0.93 06/25/2021     Lab Results  Component Value Date   HGBA1C 5.7 (H) 03/31/2021    Urinalysis    Component Value Date/Time   COLORURINE YELLOW 10/14/2020 1401   APPEARANCEUR Clear 07/09/2021 1405   LABSPEC 1.040 (H) 10/14/2020 1401   PHURINE 6.0 10/14/2020 1401   GLUCOSEU Negative 07/09/2021 1405   HGBUR NEGATIVE 10/14/2020 1401   BILIRUBINUR Negative 07/09/2021 1405   KETONESUR NEGATIVE 10/14/2020 1401   PROTEINUR Negative 07/09/2021 1405   PROTEINUR 30 (A) 10/14/2020 1401   UROBILINOGEN 0.2 05/29/2021 0856  NITRITE Negative 07/09/2021 1405   NITRITE NEGATIVE 10/14/2020 1401   LEUKOCYTESUR Negative 07/09/2021 1405   LEUKOCYTESUR NEGATIVE 10/14/2020 1401    Lab Results  Component Value Date   LABMICR See below: 07/09/2021   WBCUA 0-5 07/09/2021   LABEPIT 0-10 07/09/2021   MUCUS Present 06/25/2021   BACTERIA None seen 07/09/2021    Pertinent Imaging: Results for orders placed in visit on 06/25/21  Abdomen 1 view (KUB)  Narrative CLINICAL DATA:  Flat plate abdomen  follow-up for kidney stones.  EXAM: ABDOMEN - 1 VIEW  COMPARISON:  CT abdomen and pelvis with contrast 10/14/2020  FINDINGS: The bowel gas pattern is normal. There is least 1 punctate nonobstructive caliceal stone in the inferior pole of the left kidney, which was seen previously.  In the left hemipelvis at the level of the ischial spines, there is new demonstration of a rod-like density or calcification measuring 6 mm in length and 2.5 mm width.  This is concerning for a UVJ stone and no similar abnormality was seen on CT. There were no phleboliths in the pelvis.  Alternatively this could be a superimposed calcification in the soft tissues.  The visceral shadows are stable as visualized, with exclusion of the domes of the diaphragm from the study.  No acute osseous abnormality is seen.  IMPRESSION: 1. New demonstration of a rod-like 6 x 2.5 mm density or calcification in the left hemipelvis in the plane of the UVJ. This is concerning for a distal ureteral or UVJ calculus. Further imaging could include CT if needed for confirmation. 2. At least 1 punctate caliceal stone appears present in the lower pole left kidney. No other nephrolithiasis is visible radiographically. 3. These results will be called to the ordering clinician or representative by the radiology assistant, and communication documented in the PACS or [facility] dashboard.   Electronically Signed By: Almira Bar M.D. On: 06/26/2021 20:24  No results found for this or any previous visit.  No results found for this or any previous visit.  No results found for this or any previous visit.  Results for orders placed during the hospital encounter of 08/13/21  Ultrasound renal complete  Narrative CLINICAL DATA:  Renal stones, left ureteral stent placement on 07/03/2021  EXAM: RENAL / URINARY TRACT ULTRASOUND COMPLETE  COMPARISON:  CT done on 10/14/2020  FINDINGS: Right Kidney:  Renal  measurements: 11.9 x 4.9 x 6.3 cm = volume: 191.3 mL. There is no hydronephrosis. There is no focal cortical thinning. There is slight increase in cortical echogenicity. There is no perinephric fluid collection.  Left Kidney:  Renal measurements: 11.7 x 5.9 x 4.9 cm = volume: 179.6 mL. There is no hydronephrosis. There is slight increase in cortical echogenicity. There is no demonstrable stent in left renal pelvis. There is no perinephric fluid collection.  Bladder:  Bilateral ureteral jets there are observed.  Other:  None.  IMPRESSION: There is no hydronephrosis. There is slight increase in cortical echogenicity in both kidneys which may be a technical artifact are suggestive medical renal disease.   Electronically Signed By: Ernie Avena M.D. On: 08/13/2021 17:58  No results found for this or any previous visit.  No results found for this or any previous visit.  No results found for this or any previous visit.  No results found for this or any previous visit (from the past 24 hour(s)).

## 2021-09-20 ENCOUNTER — Encounter (HOSPITAL_BASED_OUTPATIENT_CLINIC_OR_DEPARTMENT_OTHER): Payer: Self-pay | Admitting: Nurse Practitioner

## 2021-09-24 ENCOUNTER — Ambulatory Visit (HOSPITAL_BASED_OUTPATIENT_CLINIC_OR_DEPARTMENT_OTHER): Payer: Commercial Managed Care - PPO

## 2021-09-24 ENCOUNTER — Other Ambulatory Visit (HOSPITAL_BASED_OUTPATIENT_CLINIC_OR_DEPARTMENT_OTHER): Payer: Self-pay

## 2021-09-24 DIAGNOSIS — J4520 Mild intermittent asthma, uncomplicated: Secondary | ICD-10-CM

## 2021-09-24 DIAGNOSIS — E063 Autoimmune thyroiditis: Secondary | ICD-10-CM

## 2021-09-24 DIAGNOSIS — R7303 Prediabetes: Secondary | ICD-10-CM

## 2021-09-24 DIAGNOSIS — Z0000 Encounter for general adult medical examination without abnormal findings: Secondary | ICD-10-CM

## 2021-09-24 DIAGNOSIS — E038 Other specified hypothyroidism: Secondary | ICD-10-CM

## 2021-09-24 DIAGNOSIS — E782 Mixed hyperlipidemia: Secondary | ICD-10-CM

## 2021-09-27 LAB — HEMOGLOBIN A1C
Est. average glucose Bld gHb Est-mCnc: 111 mg/dL
Hgb A1c MFr Bld: 5.5 % (ref 4.8–5.6)

## 2021-09-27 LAB — CBC WITH DIFF/PLATELET
Basophils Absolute: 0.1 10*3/uL (ref 0.0–0.2)
Basos: 1 %
EOS (ABSOLUTE): 0.2 10*3/uL (ref 0.0–0.4)
Eos: 2 %
Hematocrit: 46.2 % (ref 37.5–51.0)
Hemoglobin: 15.6 g/dL (ref 13.0–17.7)
Immature Grans (Abs): 0 10*3/uL (ref 0.0–0.1)
Immature Granulocytes: 0 %
Lymphocytes Absolute: 1.9 10*3/uL (ref 0.7–3.1)
Lymphs: 21 %
MCH: 30.6 pg (ref 26.6–33.0)
MCHC: 33.8 g/dL (ref 31.5–35.7)
MCV: 91 fL (ref 79–97)
Monocytes Absolute: 0.5 10*3/uL (ref 0.1–0.9)
Monocytes: 6 %
Neutrophils Absolute: 6.4 10*3/uL (ref 1.4–7.0)
Neutrophils: 70 %
Platelets: 188 10*3/uL (ref 150–450)
RBC: 5.09 x10E6/uL (ref 4.14–5.80)
RDW: 12.2 % (ref 11.6–15.4)
WBC: 9.1 10*3/uL (ref 3.4–10.8)

## 2021-09-27 LAB — THYROID PANEL WITH TSH
Free Thyroxine Index: 4.2 (ref 1.2–4.9)
T3 Uptake Ratio: 34 % (ref 24–39)
T4, Total: 12.4 ug/dL — ABNORMAL HIGH (ref 4.5–12.0)
TSH: 0.936 u[IU]/mL (ref 0.450–4.500)

## 2021-09-27 LAB — IRON,TIBC AND FERRITIN PANEL
Ferritin: 503 ng/mL — ABNORMAL HIGH (ref 30–400)
Iron Saturation: 44 % (ref 15–55)
Iron: 97 ug/dL (ref 38–169)
Total Iron Binding Capacity: 218 ug/dL — ABNORMAL LOW (ref 250–450)
UIBC: 121 ug/dL (ref 111–343)

## 2021-09-27 LAB — COMPREHENSIVE METABOLIC PANEL
ALT: 22 IU/L (ref 0–44)
AST: 20 IU/L (ref 0–40)
Albumin/Globulin Ratio: 2.4 — ABNORMAL HIGH (ref 1.2–2.2)
Albumin: 4.8 g/dL (ref 4.1–5.1)
Alkaline Phosphatase: 95 IU/L (ref 44–121)
BUN/Creatinine Ratio: 19 (ref 9–20)
BUN: 18 mg/dL (ref 6–24)
Bilirubin Total: 0.5 mg/dL (ref 0.0–1.2)
CO2: 24 mmol/L (ref 20–29)
Calcium: 9.4 mg/dL (ref 8.7–10.2)
Chloride: 103 mmol/L (ref 96–106)
Creatinine, Ser: 0.96 mg/dL (ref 0.76–1.27)
Globulin, Total: 2 g/dL (ref 1.5–4.5)
Glucose: 100 mg/dL — ABNORMAL HIGH (ref 70–99)
Potassium: 4.4 mmol/L (ref 3.5–5.2)
Sodium: 140 mmol/L (ref 134–144)
Total Protein: 6.8 g/dL (ref 6.0–8.5)
eGFR: 99 mL/min/{1.73_m2} (ref >59)

## 2021-09-27 LAB — LIPID PANEL
Chol/HDL Ratio: 2.9 ratio (ref 0.0–5.0)
Cholesterol, Total: 110 mg/dL (ref 100–199)
HDL: 38 mg/dL — ABNORMAL LOW (ref >39)
LDL Chol Calc (NIH): 55 mg/dL (ref 0–99)
Triglycerides: 86 mg/dL (ref 0–149)
VLDL Cholesterol Cal: 17 mg/dL (ref 5–40)

## 2021-09-27 LAB — B12 AND FOLATE PANEL
Folate: 5.6 ng/mL (ref >3.0)
Vitamin B-12: 768 pg/mL (ref 232–1245)

## 2021-09-27 LAB — VITAMIN D 25 HYDROXY (VIT D DEFICIENCY, FRACTURES): Vit D, 25-Hydroxy: 40.8 ng/mL (ref 30.0–100.0)

## 2021-10-01 ENCOUNTER — Encounter (HOSPITAL_BASED_OUTPATIENT_CLINIC_OR_DEPARTMENT_OTHER): Payer: Self-pay | Admitting: Nurse Practitioner

## 2021-10-01 ENCOUNTER — Ambulatory Visit (INDEPENDENT_AMBULATORY_CARE_PROVIDER_SITE_OTHER): Payer: Commercial Managed Care - PPO | Admitting: Nurse Practitioner

## 2021-10-01 VITALS — BP 128/88 | HR 72 | Ht 69.0 in | Wt 207.2 lb

## 2021-10-01 DIAGNOSIS — E038 Other specified hypothyroidism: Secondary | ICD-10-CM

## 2021-10-01 DIAGNOSIS — L639 Alopecia areata, unspecified: Secondary | ICD-10-CM | POA: Insufficient documentation

## 2021-10-01 DIAGNOSIS — R7303 Prediabetes: Secondary | ICD-10-CM | POA: Diagnosis not present

## 2021-10-01 DIAGNOSIS — E782 Mixed hyperlipidemia: Secondary | ICD-10-CM | POA: Diagnosis not present

## 2021-10-01 DIAGNOSIS — E063 Autoimmune thyroiditis: Secondary | ICD-10-CM

## 2021-10-01 MED ORDER — ATORVASTATIN CALCIUM 10 MG PO TABS: 10.0000 mg | ORAL_TABLET | Freq: Every day | ORAL | 3 refills | Status: DC

## 2021-10-01 NOTE — Progress Notes (Signed)
Howard Keeler, DNP, AGNP-c [name], [place] [address] [phone] Office [phone] Fax  ESTABLISHED PATIENT- Chronic Health and/or Follow-Up Visit  Blood pressure 128/88, pulse 72, height 5\' 9"  (1.753 m), weight 207 lb 3.2 oz (94 kg), SpO2 96 %.    @NAMEBYAGE @ is a 46 y.o. year old male presenting today for evaluation and management of the following: Follow-up (Patient is feeling much better. He feels that the medication is working. Patient declined flu shot wants to wait til later. /)   BRIEF HISTORY, IMPRESSION, RECOMMENDATIONS, and ROS  1. Hypothyroidism due to Hashimoto's thyroiditis He tells me he is doing very well on his medications and he is not having any new symptoms. He denies difficulty sleeping, anxiety, shortness of breath, fullness in throat, palpitations, GI disturbance, or sensitivity to hot or cold.   2. Alopecia areata Deniz endorses noting a quarter size balding patch on the top right side of his head. He reports he first noticed the patch about 6-9 weeks ago. He tells me the hair does appear to be regrowing in the area, but he is not sure what caused it. He reports that he has had this happen on other occasions but in the distant past. Each time the hair has grown back. He denies itching, redness, scaling, or changes to the skin.   3. Mixed hyperlipidemia 4. Prediabetes Wynn has been working on increasing physical activity and his eating habits. He tells me he has dropped soda from his diet and he is working out regularly. He reports he feels great with these changes. He has lost 31lbs since March   All ROS negative with exception of what is listed above.   PHYSICAL EXAM Physical Exam Vitals and nursing note reviewed.  Constitutional:      General: He is not in acute distress.    Appearance: Normal appearance.  HENT:     Head: Normocephalic and atraumatic.     Jaw: There is  normal jaw occlusion.     Salivary Glands: Right salivary gland is not diffusely enlarged or tender. Left salivary gland is not diffusely enlarged or tender.      Right Ear: Hearing, tympanic membrane, ear canal and external ear normal.     Left Ear: Hearing, tympanic membrane, ear canal and external ear normal.     Nose: Nose normal.     Right Sinus: No maxillary sinus tenderness or frontal sinus tenderness.     Left Sinus: No maxillary sinus tenderness or frontal sinus tenderness.     Mouth/Throat:     Lips: Pink.     Mouth: Mucous membranes are moist.     Tongue: Tongue does not deviate from midline.     Pharynx: Oropharynx is clear.  Eyes:     General: Lids are normal. Vision grossly intact. No scleral icterus.    Extraocular Movements: Extraocular movements intact.     Conjunctiva/sclera: Conjunctivae normal.     Pupils: Pupils are equal, round, and reactive to light.     Funduscopic exam:    Right eye: No hemorrhage. Red reflex present.        Left eye: No hemorrhage. Red reflex present. Neck:     Thyroid: No thyromegaly or thyroid tenderness.     Vascular: No carotid bruit or JVD.  Cardiovascular:     Rate and Rhythm: Normal rate and regular rhythm.     Pulses: Normal pulses.     Heart sounds: Normal  heart sounds. No murmur heard. Pulmonary:     Effort: Pulmonary effort is normal. No respiratory distress.     Breath sounds: Normal breath sounds.  Chest:  Breasts:    Breasts are symmetrical.  Abdominal:     General: Bowel sounds are normal. There is no distension.     Palpations: Abdomen is soft. There is no hepatomegaly, splenomegaly or mass.     Tenderness: There is no abdominal tenderness. There is no right CVA tenderness, left CVA tenderness or guarding.  Musculoskeletal:        General: Normal range of motion.     Cervical back: Normal range of motion and neck supple. No tenderness.     Right lower leg: No edema.     Left lower leg: No edema.  Feet:     Right  foot:     Skin integrity: Skin integrity normal.     Left foot:     Skin integrity: Skin integrity normal.  Lymphadenopathy:     Cervical: No cervical adenopathy.  Skin:    General: Skin is warm and dry.     Capillary Refill: Capillary refill takes less than 2 seconds.     Findings: No rash.  Neurological:     General: No focal deficit present.     Mental Status: He is alert and oriented to person, place, and time.     Cranial Nerves: No cranial nerve deficit.     Sensory: No sensory deficit.     Motor: No weakness.     Coordination: Coordination normal.     Gait: Gait normal.  Psychiatric:        Attention and Perception: Attention and perception normal.        Mood and Affect: Mood and affect normal.        Speech: Speech normal.        Behavior: Behavior normal. Behavior is cooperative.        Thought Content: Thought content normal.        Cognition and Memory: Cognition and memory normal.        Judgment: Judgment normal.     PLAN Problem List Items Addressed This Visit     Hypothyroidism due to Hashimoto's thyroiditis - Primary    Chronic. Controlled at this time. Slight elevation in T4 with normal TSH. Will continue current dose and monitoring every 6 months.       Mixed hyperlipidemia    Review of labs with patient today. Significant improvement of lipid levels with diet and exercise changes. Discussion about medication today. Joint decision to reduce atorvastatin dose and monitor closely in 6 months with repeat labs. We will plan to follow-up in 12 months if stable.       Relevant Medications   atorvastatin (LIPITOR) 10 MG tablet   Prediabetes    Recent A1c reviewed with patient in the office today. Weight loss and dietary changes have resulted in significant improvement and normalization of blood glucose levels. Patient praised for his efforts and success. Recommend continuation on current plan. We will monitor in 6 months with repeat labs.      Alopecia areata     Symptoms and presentation consistent with alopecia areata. Suspect autoimmune related. Given that there is hair regrowth will plan to monitor for now. Patient instructed to follow-up if he begins to notice new hair loss or lack of regrowth.       Return in about 1 year (around 10/02/2022) for Lipid Labs only in  6 months- F/U in 1 year.   Shawna Clamp, DNP, AGNP-c 10/01/2021  8:13 AM

## 2021-10-01 NOTE — Patient Instructions (Addendum)
It was a pleasure seeing you today. I hope your time spent with [place] was pleasant and helpful. Please let us know if there is anything we can do to improve the service you receive.   You are looking amazing!!! Please keep up the good work!!  I have decreased the atorvastatin to 10mg . We will plan to recheck the labs in 6 months to make sure this is good.   I wont need to see you back for a year unless you need to see me sooner.    Important Office Information Lab Results If labs were ordered, please note that you will see results through MyChart as soon as they come available from [facility].  It takes up to 5 business days for the results to be routed to me and for me to review them once all of the lab results have come through from [facility]. I will make recommendations based on your results and send these through MyChart or someone from the office will call you to discuss. If your labs are abnormal, we may contact you to schedule a visit to discuss the results and make recommendations.  If you have not heard from [facility] within 5 business days or you have waited longer than a week and your lab results have not come through on MyChart, please feel free to call the office or send a message through MyChart to follow-up on these labs.   Referrals If referrals were placed today, the office where the referral was sent will contact you either by phone or through MyChart to set up scheduling. Please note that it can take up to a week for the referral office to contact you. If you do not hear from them in a week, please contact the referral office directly to inquire about scheduling.   Condition Treated If your condition worsens or you begin to have new symptoms, please schedule a follow-up appointment for further evaluation. If you are not sure if an appointment is needed, you may call the office to leave a message for the nurse and someone will contact you with recommendations.  If you have an urgent or life  threatening emergency, please do not call the office, but seek emergency evaluation by calling 911 or going to the nearest emergency room for evaluation.   MyChart and Phone Calls Please do not use MyChart for urgent messages. It may take up to 3 business days for MyChart messages to be read by staff and if they are unable to handle the request, an additional 3 business days for them to be routed to me and for my response.  Messages sent to the provider through MyChart do not come directly to the provider, please allow time for these messages to be routed and for me to respond.  We get a large volume of MyChart messages daily and these are responded to in the order received.   For urgent messages, please call the office at [phone] and speak with the front office staff or leave a message on the line of my assistant for guidance.  We are seeing patients from the hours of 8:00 am through 5:00 pm and calls directly to the nurse may not be answered immediately due to seeing patients, but your call will be returned as soon as possible.  Phone  messages received after 4:00 PM Monday through Thursday may not be returned until the following business day. Phone messages received after 11:00 AM on Friday may not be returned until Monday.  After Hours We share on call hours with providers from other offices. If you have an urgent need after hours that cannot wait until the next business day, please contact the on call provider by calling the office number. A nurse will speak with you and contact the provider if needed for recommendations.  If you have an urgent or life threatening emergency after hours, please do not call the on call provider, but seek emergency evaluation by calling 911 or going to the nearest emergency room for evaluation.   Paperwork All paperwork requires a minimum of 5 days to complete and return to you or the designated personnel. Please keep this in mind when bringing in forms or  sending requests for paperwork completion to the office.

## 2021-10-01 NOTE — Assessment & Plan Note (Signed)
Recent A1c reviewed with patient in the office today. Weight loss and dietary changes have resulted in significant improvement and normalization of blood glucose levels. Patient praised for his efforts and success. Recommend continuation on current plan. We will monitor in 6 months with repeat labs.

## 2021-10-01 NOTE — Assessment & Plan Note (Signed)
Chronic. Controlled at this time. Slight elevation in T4 with normal TSH. Will continue current dose and monitoring every 6 months.

## 2021-10-01 NOTE — Assessment & Plan Note (Signed)
Symptoms and presentation consistent with alopecia areata. Suspect autoimmune related. Given that there is hair regrowth will plan to monitor for now. Patient instructed to follow-up if he begins to notice new hair loss or lack of regrowth.

## 2021-10-01 NOTE — Assessment & Plan Note (Signed)
Review of labs with patient today. Significant improvement of lipid levels with diet and exercise changes. Discussion about medication today. Joint decision to reduce atorvastatin dose and monitor closely in 6 months with repeat labs. We will plan to follow-up in 12 months if stable.

## 2022-01-02 ENCOUNTER — Encounter: Payer: Self-pay | Admitting: Nurse Practitioner

## 2022-01-02 DIAGNOSIS — L639 Alopecia areata, unspecified: Secondary | ICD-10-CM

## 2022-01-05 MED ORDER — DESOXIMETASONE 0.25 % EX LIQD: CUTANEOUS | 6 refills | Status: DC

## 2022-02-17 ENCOUNTER — Ambulatory Visit (HOSPITAL_COMMUNITY)
Admission: RE | Admit: 2022-02-17 | Discharge: 2022-02-17 | Disposition: A | Discharge status: Home / Self Care | Payer: Commercial Managed Care - PPO | Source: Ambulatory Visit | Attending: Physician Assistant | Admitting: Physician Assistant

## 2022-02-17 ENCOUNTER — Ambulatory Visit: Payer: Commercial Managed Care - PPO | Admitting: Physician Assistant

## 2022-02-17 DIAGNOSIS — N200 Calculus of kidney: Secondary | ICD-10-CM | POA: Diagnosis not present

## 2022-02-23 ENCOUNTER — Encounter: Payer: Self-pay | Admitting: Urology

## 2022-02-23 ENCOUNTER — Ambulatory Visit (INDEPENDENT_AMBULATORY_CARE_PROVIDER_SITE_OTHER): Payer: Commercial Managed Care - PPO | Admitting: Urology

## 2022-02-23 VITALS — BP 123/82 | HR 70

## 2022-02-23 DIAGNOSIS — N200 Calculus of kidney: Secondary | ICD-10-CM | POA: Diagnosis not present

## 2022-02-23 LAB — URINALYSIS, ROUTINE W REFLEX MICROSCOPIC
Bilirubin, UA: NEGATIVE
Glucose, UA: NEGATIVE
Ketones, UA: NEGATIVE
Leukocytes,UA: NEGATIVE
Nitrite, UA: NEGATIVE
Protein,UA: NEGATIVE
Specific Gravity, UA: 1.01 (ref 1.005–1.030)
Urobilinogen, Ur: 0.2 mg/dL (ref 0.2–1.0)
pH, UA: 5.5 (ref 5.0–7.5)

## 2022-02-23 LAB — MICROSCOPIC EXAMINATION
Bacteria, UA: NONE SEEN
WBC, UA: NONE SEEN /hpf (ref 0–5)

## 2022-02-23 NOTE — Progress Notes (Unsigned)
02/23/2022 11:23 AM   Howard Alexander March 05, 1975 [id number]  Referring provider: Orma Render, NP [place],  [place] [address]  No chief complaint on file.   HPI:  Patient of Dr. Alyson Ingles scheduled for kidney stones.  A September 2022 CT showed a punctate left renal stone.  KUB June 2023 with a 6 mm left distal stone and a punctate left lower pole stone.  He underwent a left distal ureteroscopy for a 6 mm distal stone June 2023.  Renal ultrasound was done July 2023 which showed no abnormalities.  Stent was removed by the patient.    This is his only stone episode.   Today, seen for the above.  Another ultrasound was obtained January 2024 again with no abnormalities.  UA clear. No flank pain or stone passage.   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   PMH: Past Medical History:  Diagnosis Date   Allergy    Seasonal/pollen   Asthma    Hypercholesterolemia    Hypothyroidism due to Hashimoto's thyroiditis 03/31/2021   Left nephrolithiasis 07/24/2021   Non-traumatic rhabdomyolysis 07/24/2021    Surgical History: Past Surgical History:  Procedure Laterality Date   CYSTOSCOPY WITH RETROGRADE PYELOGRAM, URETEROSCOPY AND STENT PLACEMENT Left 07/03/2021   Procedure: CYSTOSCOPY WITH RETROGRADE PYELOGRAM, URETEROSCOPY AND STENT PLACEMENT;  Surgeon: Cleon Gustin, MD;  Location: [facility];  Service: Urology;  Laterality: Left;   STONE EXTRACTION WITH BASKET Left 07/03/2021   Procedure: STONE EXTRACTION WITH BASKET;  Surgeon: Cleon Gustin, MD;  Location: [facility];  Service: Urology;  Laterality: Left;    Home Medications:  Allergies as of 02/23/2022   No Known Allergies      Medication List        Accurate as of February 23, 2022 11:23 AM. If you have any questions, ask your nurse or doctor.          albuterol 108 (90 Base) MCG/ACT inhaler Commonly known as: VENTOLIN HFA Inhale 1-2 puffs into the lungs every 6 (six) hours as needed for shortness  of breath (Allergies).   atorvastatin 10 MG tablet Commonly known as: LIPITOR Take 1 tablet (10 mg total) by mouth daily.   Desoximetasone 0.25 % Liqd Apply to areas of hair loss on scalp once daily.   levothyroxine 125 MCG tablet Commonly known as: SYNTHROID Take 1 tablet (125 mcg total) by mouth daily.   montelukast 10 MG tablet Commonly known as: SINGULAIR SMARTSIG:1 Tablet(s) By Mouth Every Evening   Vitamin D-3 125 MCG (5000 UT) Tabs Take 5,000 Units by mouth daily.        Allergies: No Known Allergies  Family History: Family History  Problem Relation Age of Onset   Cancer Father    ADD / ADHD Son     Social History:  reports that he has never smoked. He has never been exposed to tobacco smoke. He has never used smokeless tobacco. He reports that he does not currently use alcohol after a past usage of about 1.0 standard drink of alcohol per week. He reports that he does not use drugs.   Physical Exam: BP 123/82   Pulse 70   Constitutional:  Alert and oriented, No acute distress. HEENT:  AT, moist mucus membranes.  Trachea midline, no masses. Cardiovascular: No clubbing, cyanosis, or edema. Respiratory: Normal respiratory effort, no increased work of breathing. GI: Abdomen is soft, nontender, nondistended, no abdominal masses GU: No CVA tenderness Skin: No rashes, bruises or  suspicious lesions. Neurologic: Grossly intact, no focal deficits, moving all 4 extremities. Psychiatric: Normal mood and affect.  Laboratory Data: Lab Results  Component Value Date   WBC 9.1 09/24/2021   HGB 15.6 09/24/2021   HCT 46.2 09/24/2021   MCV 91 09/24/2021   PLT 188 09/24/2021    Lab Results  Component Value Date   CREATININE 0.96 09/24/2021    No results found for: "PSA"  No results found for: "TESTOSTERONE"  Lab Results  Component Value Date   HGBA1C 5.5 09/24/2021    Urinalysis    Component Value Date/Time   COLORURINE YELLOW 10/14/2020 1401    APPEARANCEUR Clear 08/20/2021 1030   LABSPEC 1.040 (H) 10/14/2020 1401   PHURINE 6.0 10/14/2020 1401   GLUCOSEU Negative 08/20/2021 1030   HGBUR NEGATIVE 10/14/2020 1401   BILIRUBINUR Negative 08/20/2021 1030   KETONESUR NEGATIVE 10/14/2020 1401   PROTEINUR Negative 08/20/2021 1030   PROTEINUR 30 (A) 10/14/2020 1401   UROBILINOGEN 0.2 05/29/2021 0856   NITRITE Negative 08/20/2021 1030   NITRITE NEGATIVE 10/14/2020 1401   LEUKOCYTESUR Negative 08/20/2021 1030   LEUKOCYTESUR NEGATIVE 10/14/2020 1401    Lab Results  Component Value Date   LABMICR Comment 08/20/2021   WBCUA 0-5 07/09/2021   LABEPIT 0-10 07/09/2021   MUCUS Present 06/25/2021   BACTERIA None seen 07/09/2021    Pertinent Imaging: Kub, ct and renal US images reviewed  Results for orders placed in visit on 06/25/21  Abdomen 1 view (KUB)  Narrative CLINICAL DATA:  Flat plate abdomen follow-up for kidney stones.  EXAM: ABDOMEN - 1 VIEW  COMPARISON:  CT abdomen and pelvis with contrast 10/14/2020  FINDINGS: The bowel gas pattern is normal. There is least 1 punctate nonobstructive caliceal stone in the inferior pole of the left kidney, which was seen previously.  In the left hemipelvis at the level of the ischial spines, there is new demonstration of a rod-like density or calcification measuring 6 mm in length and 2.5 mm width.  This is concerning for a UVJ stone and no similar abnormality was seen on CT. There were no phleboliths in the pelvis.  Alternatively this could be a superimposed calcification in the soft tissues.  The visceral shadows are stable as visualized, with exclusion of the domes of the diaphragm from the study.  No acute osseous abnormality is seen.  IMPRESSION: 1. New demonstration of a rod-like 6 x 2.5 mm density or calcification in the left hemipelvis in the plane of the UVJ. This is concerning for a distal ureteral or UVJ calculus. Further imaging could include CT if needed for  confirmation. 2. At least 1 punctate caliceal stone appears present in the lower pole left kidney. No other nephrolithiasis is visible radiographically. 3. These results will be called to the ordering clinician or representative by the radiology assistant, and communication documented in the PACS or [facility] dashboard.   Electronically Signed By: Telford Nab M.D. On: 06/26/2021 20:24  No results found for this or any previous visit.  No results found for this or any previous visit.  No results found for this or any previous visit.  Results for orders placed during the hospital encounter of 02/17/22  Ultrasound renal complete  Narrative CLINICAL DATA:  Nephrolithiasis status post lithotripsy.  EXAM: RENAL / URINARY TRACT ULTRASOUND COMPLETE  COMPARISON:  Renal ultrasound dated 08/13/2021.  FINDINGS: Right Kidney:  Renal measurements: 10.8 x 6.3 x 6.5 cm = volume: 232 mL. Echogenicity within normal limits. No mass or hydronephrosis  visualized.  Left Kidney:  Renal measurements: 11.5 x 5.8 x 5.2 cm = volume: 179 mL. Echogenicity within normal limits. No mass or hydronephrosis visualized.  Bladder:  Appears normal for degree of bladder distention.  Other:  None.  IMPRESSION: Normal renal ultrasound.   Electronically Signed By: Romona Curls M.D. On: 02/17/2022 16:51  No valid procedures specified. No results found for this or any previous visit.  No results found for this or any previous visit.   Assessment & Plan:    1. Nephrolithiasis Disc diet changes to prevent stone. See in 1 year with a KUB prior.  - Urinalysis, Routine w reflex microscopic   No follow-ups on file.  Jerilee Field, MD  [facility]  [address] [place], [place] [address] [phone]

## 2022-03-18 ENCOUNTER — Telehealth: Payer: Self-pay | Admitting: Nurse Practitioner

## 2022-03-18 DIAGNOSIS — R7303 Prediabetes: Secondary | ICD-10-CM

## 2022-03-18 DIAGNOSIS — E038 Other specified hypothyroidism: Secondary | ICD-10-CM

## 2022-03-18 DIAGNOSIS — E782 Mixed hyperlipidemia: Secondary | ICD-10-CM

## 2022-03-18 DIAGNOSIS — L639 Alopecia areata, unspecified: Secondary | ICD-10-CM

## 2022-03-18 NOTE — Telephone Encounter (Signed)
Orders placed for labs. Thank you.

## 2022-03-18 NOTE — Telephone Encounter (Signed)
Pt said he is supposed to have 6 month lipids checked & he would also like to have his thyroid check due to alopecia concerns.  Is this ok?  Please put in orders

## 2022-03-19 NOTE — Telephone Encounter (Signed)
done 

## 2022-03-20 ENCOUNTER — Other Ambulatory Visit (HOSPITAL_BASED_OUTPATIENT_CLINIC_OR_DEPARTMENT_OTHER): Payer: Self-pay | Admitting: Nurse Practitioner

## 2022-03-20 DIAGNOSIS — J4520 Mild intermittent asthma, uncomplicated: Secondary | ICD-10-CM

## 2022-03-20 DIAGNOSIS — E038 Other specified hypothyroidism: Secondary | ICD-10-CM

## 2022-03-20 DIAGNOSIS — Z13228 Encounter for screening for other metabolic disorders: Secondary | ICD-10-CM

## 2022-03-25 ENCOUNTER — Telehealth: Payer: Self-pay | Admitting: Internal Medicine

## 2022-03-25 DIAGNOSIS — J4520 Mild intermittent asthma, uncomplicated: Secondary | ICD-10-CM

## 2022-03-25 NOTE — Telephone Encounter (Signed)
Starting April 1st, pt's insurance will not cover current medication Albuteral PA IN HFA 200. Preferred medication is albuterol sulfate aerosol, levalbuterol tartrate aerosol

## 2022-03-27 ENCOUNTER — Other Ambulatory Visit: Payer: Commercial Managed Care - PPO

## 2022-03-27 DIAGNOSIS — E782 Mixed hyperlipidemia: Secondary | ICD-10-CM

## 2022-03-27 DIAGNOSIS — L639 Alopecia areata, unspecified: Secondary | ICD-10-CM

## 2022-03-27 DIAGNOSIS — E063 Autoimmune thyroiditis: Secondary | ICD-10-CM

## 2022-03-27 DIAGNOSIS — R7303 Prediabetes: Secondary | ICD-10-CM

## 2022-03-27 MED ORDER — ALBUTEROL SULFATE HFA 108 (90 BASE) MCG/ACT IN AERS: 2.0000 | INHALATION_SPRAY | Freq: Four times a day (QID) | RESPIRATORY_TRACT | 6 refills | Status: AC | PRN

## 2022-03-28 LAB — COMPREHENSIVE METABOLIC PANEL
ALT: 20 IU/L (ref 0–44)
AST: 21 IU/L (ref 0–40)
Albumin/Globulin Ratio: 2.3 — ABNORMAL HIGH (ref 1.2–2.2)
Albumin: 4.8 g/dL (ref 4.1–5.1)
Alkaline Phosphatase: 75 IU/L (ref 44–121)
BUN/Creatinine Ratio: 28 — ABNORMAL HIGH (ref 9–20)
BUN: 27 mg/dL — ABNORMAL HIGH (ref 6–24)
Bilirubin Total: 0.7 mg/dL (ref 0.0–1.2)
CO2: 22 mmol/L (ref 20–29)
Calcium: 9.5 mg/dL (ref 8.7–10.2)
Chloride: 102 mmol/L (ref 96–106)
Creatinine, Ser: 0.96 mg/dL (ref 0.76–1.27)
Globulin, Total: 2.1 g/dL (ref 1.5–4.5)
Glucose: 95 mg/dL (ref 70–99)
Potassium: 4.1 mmol/L (ref 3.5–5.2)
Sodium: 139 mmol/L (ref 134–144)
Total Protein: 6.9 g/dL (ref 6.0–8.5)
eGFR: 98 mL/min/{1.73_m2} (ref >59)

## 2022-03-28 LAB — CBC WITH DIFFERENTIAL/PLATELET
Basophils Absolute: 0.1 10*3/uL (ref 0.0–0.2)
Basos: 1 %
EOS (ABSOLUTE): 0.2 10*3/uL (ref 0.0–0.4)
Eos: 2 %
Hematocrit: 44.8 % (ref 37.5–51.0)
Hemoglobin: 15.5 g/dL (ref 13.0–17.7)
Immature Grans (Abs): 0 10*3/uL (ref 0.0–0.1)
Immature Granulocytes: 0 %
Lymphocytes Absolute: 2.1 10*3/uL (ref 0.7–3.1)
Lymphs: 22 %
MCH: 30.6 pg (ref 26.6–33.0)
MCHC: 34.6 g/dL (ref 31.5–35.7)
MCV: 88 fL (ref 79–97)
Monocytes Absolute: 0.7 10*3/uL (ref 0.1–0.9)
Monocytes: 7 %
Neutrophils Absolute: 6.3 10*3/uL (ref 1.4–7.0)
Neutrophils: 68 %
Platelets: 193 10*3/uL (ref 150–450)
RBC: 5.07 x10E6/uL (ref 4.14–5.80)
RDW: 11.8 % (ref 11.6–15.4)
WBC: 9.2 10*3/uL (ref 3.4–10.8)

## 2022-03-28 LAB — TSH: TSH: 2.39 u[IU]/mL (ref 0.450–4.500)

## 2022-03-28 LAB — LIPID PANEL
Chol/HDL Ratio: 3 ratio (ref 0.0–5.0)
Cholesterol, Total: 123 mg/dL (ref 100–199)
HDL: 41 mg/dL (ref >39)
LDL Chol Calc (NIH): 64 mg/dL (ref 0–99)
Triglycerides: 93 mg/dL (ref 0–149)
VLDL Cholesterol Cal: 18 mg/dL (ref 5–40)

## 2022-03-28 LAB — T4, FREE: Free T4: 1.8 ng/dL — ABNORMAL HIGH (ref 0.82–1.77)

## 2022-03-28 LAB — B12 AND FOLATE PANEL
Folate: 4.9 ng/mL (ref >3.0)
Vitamin B-12: 804 pg/mL (ref 232–1245)

## 2022-03-28 LAB — HEMOGLOBIN A1C
Est. average glucose Bld gHb Est-mCnc: 111 mg/dL
Hgb A1c MFr Bld: 5.5 % (ref 4.8–5.6)

## 2022-03-28 LAB — VITAMIN D 25 HYDROXY (VIT D DEFICIENCY, FRACTURES): Vit D, 25-Hydroxy: 44.3 ng/mL (ref 30.0–100.0)

## 2022-04-01 ENCOUNTER — Ambulatory Visit (HOSPITAL_BASED_OUTPATIENT_CLINIC_OR_DEPARTMENT_OTHER): Payer: Commercial Managed Care - PPO

## 2022-08-18 DIAGNOSIS — J988 Other specified respiratory disorders: Secondary | ICD-10-CM | POA: Diagnosis not present

## 2022-08-18 DIAGNOSIS — K760 Fatty (change of) liver, not elsewhere classified: Secondary | ICD-10-CM | POA: Insufficient documentation

## 2022-08-18 DIAGNOSIS — J029 Acute pharyngitis, unspecified: Secondary | ICD-10-CM | POA: Diagnosis not present

## 2022-08-18 DIAGNOSIS — R634 Abnormal weight loss: Secondary | ICD-10-CM

## 2022-08-18 DIAGNOSIS — R509 Fever, unspecified: Secondary | ICD-10-CM | POA: Diagnosis not present

## 2022-08-18 DIAGNOSIS — R051 Acute cough: Secondary | ICD-10-CM | POA: Diagnosis not present

## 2022-08-18 HISTORY — DX: Abnormal weight loss: R63.4

## 2022-10-05 ENCOUNTER — Ambulatory Visit: Payer: Commercial Managed Care - PPO | Admitting: Nurse Practitioner

## 2022-10-05 ENCOUNTER — Ambulatory Visit (HOSPITAL_BASED_OUTPATIENT_CLINIC_OR_DEPARTMENT_OTHER): Payer: Commercial Managed Care - PPO | Admitting: Nurse Practitioner

## 2022-10-08 ENCOUNTER — Encounter: Payer: Self-pay | Admitting: Nurse Practitioner

## 2022-10-08 ENCOUNTER — Ambulatory Visit: Payer: BC Managed Care – PPO | Admitting: Nurse Practitioner

## 2022-10-08 VITALS — BP 128/82 | HR 69 | Wt 202.6 lb

## 2022-10-08 DIAGNOSIS — Z23 Encounter for immunization: Secondary | ICD-10-CM

## 2022-10-08 DIAGNOSIS — R7303 Prediabetes: Secondary | ICD-10-CM | POA: Diagnosis not present

## 2022-10-08 DIAGNOSIS — E038 Other specified hypothyroidism: Secondary | ICD-10-CM | POA: Diagnosis not present

## 2022-10-08 DIAGNOSIS — E782 Mixed hyperlipidemia: Secondary | ICD-10-CM

## 2022-10-08 DIAGNOSIS — Z130 Encounter for screening for diseases of the blood and blood-forming organs and certain disorders involving the immune mechanism: Secondary | ICD-10-CM

## 2022-10-08 DIAGNOSIS — Z6832 Body mass index (BMI) 32.0-32.9, adult: Secondary | ICD-10-CM

## 2022-10-08 DIAGNOSIS — Z1329 Encounter for screening for other suspected endocrine disorder: Secondary | ICD-10-CM

## 2022-10-08 DIAGNOSIS — E063 Autoimmune thyroiditis: Secondary | ICD-10-CM | POA: Diagnosis not present

## 2022-10-08 DIAGNOSIS — J4520 Mild intermittent asthma, uncomplicated: Secondary | ICD-10-CM

## 2022-10-08 DIAGNOSIS — Z1321 Encounter for screening for nutritional disorder: Secondary | ICD-10-CM

## 2022-10-08 DIAGNOSIS — Z13228 Encounter for screening for other metabolic disorders: Secondary | ICD-10-CM

## 2022-10-08 NOTE — Patient Instructions (Signed)
We will check your testosterone levels today. If these are low, we can consider replacement to see if that helps. If the testosterone is normal then we will look into the injectables. It looks like Zepbound is covered by your insurance.    WEIGHT LOSS PLANNING Your progress today shows:     10/08/2022    9:37 AM 02/23/2022   11:09 AM 10/01/2021    8:10 AM  Vitals with BMI  Height   5\' 9"   Weight 202 lbs 10 oz  207 lbs 3 oz  BMI   30.58  Systolic 128 123 161  Diastolic 82 82 88  Pulse 69 70 72    For best management of weight, it is vital to balance intake versus output. This means the number of calories burned per day must be less than the calories you take in with food and drink.   I recommend trying to follow a diet with the following: Calories: 1200-1500 calories per day Carbohydrates: 150-180 grams of carbohydrates per day  Why: Gives your body enough "quick fuel" for cells to maintain normal function without sending them into starvation mode.  Protein: At least 90 grams of protein per day- 30 grams with each meal Why: Protein takes longer and uses more energy than carbohydrates to break down for fuel. The carbohydrates in your meals serves as quick energy sources and proteins help use some of that extra quick energy to break down to produce long term energy. This helps you not feel hungry as quickly and protein breakdown burns calories.  Water: Drink AT LEAST 64 ounces of water per day  Why: Water is essential to healthy metabolism. Water helps to fill the stomach and keep you fuller longer. Water is required for healthy digestion and filtering of waste in the body.  Fat: Limit fats in your diet- when choosing fats, choose foods with lower fats content such as lean meats (chicken, fish, [place]).  Why: Increased fat intake leads to storage "for later". Once you burn your carbohydrate energy, your body goes into fat and protein breakdown mode to help you loose weight.  Cholesterol:  Fats and oils that are LIQUID at room temperature are best. Choose vegetable oils (olive oil, avocado oil, nuts). Avoid fats that are SOLID at room temperature (animal fats, processed meats). Healthy fats are often found in whole grains, beans, nuts, seeds, and berries.  Why: Elevated cholesterol levels lead to build up of cholesterol on the inside of your blood vessels. This will eventually cause the blood vessels to become hard and can lead to high blood pressure and damage to your organs. When the blood flow is reduced, but the pressure is high from cholesterol buildup, parts of the cholesterol can break off and form clots that can go to the brain or heart leading to a stroke or heart attack.  Fiber: Increase amount of SOLUBLE the fiber in your diet. This helps to fill you up, lowers cholesterol, and helps with digestion. Some foods high in soluble fiber are oats, peas, beans, apples, carrots, barley, and citrus fruits.   Why: Fiber fills you up, helps remove excess cholesterol, and aids in healthy digestion which are all very important in weight management.   I recommend the following as a minimum activity routine: Purposeful walk or other physical activity at least 20 minutes every single day. This means purposefully taking a walk, jog, bike, swim, treadmill, elliptical, dance, etc.  This activity should be ABOVE your normal daily activities, such as  walking at work. Goal exercise should be at least 150 minutes a week- work your way up to this.   Heart Rate: Your maximum exercise heart rate should be 220 - Your Age in Years. When exercising, get your heart rate up, but avoid going over the maximum targeted heart rate.  60-70% of your maximum heart rate is where you tend to burn the most fat. To find this number:  220 - Age In Years= Max HR  Max HR x 0.6 (or 0.7) = Fat Burning HR The Fat Burning HR is your goal heart rate while working out to burn the most fat.  NEVER exercise to the point your  feel lightheaded, weak, nauseated, dizzy. If you experience ANY of these symptoms- STOP exercise! Allow yourself to cool down and your heart rate to come down. Then restart slower next time.  If at ANY TIME you feel chest pain or chest pressure during exercise, STOP IMMEDIATELY and seek medical attention.

## 2022-10-08 NOTE — Progress Notes (Signed)
Shawna Clamp, DNP, AGNP-c [facility]  [address] [place], [place] [address] [phone]  ESTABLISHED PATIENT- Chronic Health and/or Follow-Up Visit  Blood pressure 128/82, pulse 69, weight 202 lb 9.6 oz (91.9 kg).    Howard Alexander is a 47 y.o. year old male presenting today for evaluation and management of chronic conditions.   Hypothyroid Howard Alexander tells me he has been taking his medication without missed doses and he currently denies any symptoms of temperature sensitivity, palpitations, GI changes, or skin concerns.   PreDM, HLD Howard Alexander tells me he has been working on losing weight with diet and a rigorous exercise plan, but he has reached a plateau and he can't seem to get out of the range he is in at this time. He tells me that he has dropped a pants size, but has not seen changes in the scale. He is monitoring his diet closely and choosing options that are low in fat and carbohydrates. He tells me that he also feels that building muscle mass is difficult.   All ROS negative with exception of what is listed above.   PHYSICAL EXAM Physical Exam Vitals and nursing note reviewed.  Constitutional:      Appearance: Normal appearance.  HENT:     Head: Normocephalic.  Eyes:     Pupils: Pupils are equal, round, and reactive to light.  Neck:     Vascular: No carotid bruit.  Cardiovascular:     Rate and Rhythm: Normal rate and regular rhythm.     Pulses: Normal pulses.     Heart sounds: Normal heart sounds.  Pulmonary:     Effort: Pulmonary effort is normal.     Breath sounds: Normal breath sounds.  Musculoskeletal:        General: Normal range of motion.     Cervical back: Normal range of motion and neck supple. No tenderness.     Right lower leg: No edema.     Left lower leg: No edema.  Lymphadenopathy:     Cervical: No cervical adenopathy.  Skin:    General: Skin is warm.     Capillary Refill: Capillary refill takes less than 2 seconds.   Neurological:     General: No focal deficit present.     Mental Status: He is alert and oriented to person, place, and time.  Psychiatric:        Mood and Affect: Mood normal.     PLAN Problem List Items Addressed This Visit     Hypothyroidism due to Hashimoto's thyroiditis    Chronic. Currently managed with levothyroxine. He appears to be doing well at this time with no reported symptoms. Labs are due today for monitoring.  Plan: - Will order labs to ensure dosing is appropriate - If changes need to be made to your medication dose, I will let you know.  - Plan to follow-up in 6 months or sooner if needed.       Relevant Orders   Lipid Panel (Completed)   TSH (Completed)   T4, Free (Completed)   Testosterone, Free, Total, SHBG (Completed)   Mixed hyperlipidemia    Chronic. Lipids stable. No alarm symptoms present at this time. LDL Goal < 100. Controlled with  atorvastatin .  Plan: Continue current lipid-lowering therapy. orders written for new lab studies as appropriate; see orders Recommend Strict diet and exercise Diet and exercise recommendations provided.  Follow-up in 6months or sooner based on lab findings as appropriate.  Relevant Orders   Lipid Panel (Completed)   TSH (Completed)   T4, Free (Completed)   Testosterone, Free, Total, SHBG (Completed)   Mild intermittent asthma without complication    Chronic. Stable. No symptoms or concerns present.  Plan: - Continue current therapy       Prediabetes    Chronic. Currently managed with diet and exercise. He is not having any symptoms at this time. He has been working diligently on [employer], which is great. Repeat labs are due today.  Plan: - We will monitor labs today - If changes are needed based on the labs, I will be in touch with you - Keep working on low carbohydrate diet and exercise, you are doing great!! - We will plan to follow-up in 6 months unless you need to be seen sooner.        Relevant Orders   Lipid Panel (Completed)   TSH (Completed)   T4, Free (Completed)   Testosterone, Free, Total, SHBG (Completed)   BMI 32.0-32.9,adult    BMI starting 35.15, today down to 29. He has shown success in loss of inches, but is concerned with the lack of movement on the scale. We discussed that the scale is not always a great indicator of the changes within the body. He does have concern with limited muscle mass he feels he has developed. We also discussed monitoring testosterone levels and he is in agreement to this.  Plan: - Continue with your diet and exercise plan - Monitor your measurements of chest, abdomen, arms, and legs as opposed to relying solely on the scales - We will check labs today to ensure that your testosterone levels are where they need to be.       Relevant Orders   Lipid Panel (Completed)   TSH (Completed)   T4, Free (Completed)   Testosterone, Free, Total, SHBG (Completed)   Other Visit Diagnoses     Need for influenza vaccination    -  Primary   Relevant Orders   Flu vaccine trivalent PF, 6mos and older(Flulaval,Afluria,Fluarix,Fluzone) (Completed)   Screening for endocrine, nutritional, metabolic and immunity disorder           No follow-ups on file. s, >50% spent counseling, care coordination, chart review, and documentation.   Shawna Clamp, DNP, AGNP-c

## 2022-10-11 LAB — LIPID PANEL
Chol/HDL Ratio: 3.1 ratio (ref 0.0–5.0)
Cholesterol, Total: 152 mg/dL (ref 100–199)
HDL: 49 mg/dL (ref >39)
LDL Chol Calc (NIH): 81 mg/dL (ref 0–99)
Triglycerides: 122 mg/dL (ref 0–149)
VLDL Cholesterol Cal: 22 mg/dL (ref 5–40)

## 2022-10-11 LAB — T4, FREE: Free T4: 1.64 ng/dL (ref 0.82–1.77)

## 2022-10-11 LAB — TESTOSTERONE, FREE, TOTAL, SHBG
Sex Hormone Binding: 33.2 nmol/L (ref 16.5–55.9)
Testosterone, Free: 12.2 pg/mL (ref 6.8–21.5)
Testosterone: 479 ng/dL (ref 264–916)

## 2022-10-11 LAB — TSH: TSH: 0.925 u[IU]/mL (ref 0.450–4.500)

## 2022-10-13 ENCOUNTER — Encounter: Payer: Self-pay | Admitting: Nurse Practitioner

## 2022-10-13 ENCOUNTER — Other Ambulatory Visit: Payer: Self-pay

## 2022-10-13 ENCOUNTER — Telehealth: Payer: Self-pay | Admitting: Nurse Practitioner

## 2022-10-13 DIAGNOSIS — E063 Autoimmune thyroiditis: Secondary | ICD-10-CM

## 2022-10-13 DIAGNOSIS — Z130 Encounter for screening for diseases of the blood and blood-forming organs and certain disorders involving the immune mechanism: Secondary | ICD-10-CM

## 2022-10-13 DIAGNOSIS — E782 Mixed hyperlipidemia: Secondary | ICD-10-CM

## 2022-10-13 DIAGNOSIS — Z6832 Body mass index (BMI) 32.0-32.9, adult: Secondary | ICD-10-CM

## 2022-10-13 DIAGNOSIS — J4520 Mild intermittent asthma, uncomplicated: Secondary | ICD-10-CM

## 2022-10-13 DIAGNOSIS — R7303 Prediabetes: Secondary | ICD-10-CM

## 2022-10-13 DIAGNOSIS — Z1329 Encounter for screening for other suspected endocrine disorder: Secondary | ICD-10-CM

## 2022-10-13 MED ORDER — LEVOTHYROXINE SODIUM 125 MCG PO TABS: 125.0000 ug | ORAL_TABLET | Freq: Every day | ORAL | 3 refills | Status: DC

## 2022-10-13 MED ORDER — MONTELUKAST SODIUM 10 MG PO TABS: ORAL_TABLET | ORAL | 3 refills | Status: DC

## 2022-10-13 MED ORDER — ATORVASTATIN CALCIUM 10 MG PO TABS: 10.0000 mg | ORAL_TABLET | Freq: Every day | ORAL | 3 refills | Status: DC

## 2022-10-13 NOTE — Assessment & Plan Note (Signed)
Chronic. Currently managed with diet and exercise. He is not having any symptoms at this time. He has been working diligently on [employer], which is great. Repeat labs are due today.  Plan: - We will monitor labs today - If changes are needed based on the labs, I will be in touch with you - Keep working on low carbohydrate diet and exercise, you are doing great!! - We will plan to follow-up in 6 months unless you need to be seen sooner.

## 2022-10-13 NOTE — Assessment & Plan Note (Signed)
BMI starting 35.15, today down to 29. He has shown success in loss of inches, but is concerned with the lack of movement on the scale. We discussed that the scale is not always a great indicator of the changes within the body. He does have concern with limited muscle mass he feels he has developed. We also discussed monitoring testosterone levels and he is in agreement to this.  Plan: - Continue with your diet and exercise plan - Monitor your measurements of chest, abdomen, arms, and legs as opposed to relying solely on the scales - We will check labs today to ensure that your testosterone levels are where they need to be.

## 2022-10-13 NOTE — Assessment & Plan Note (Signed)
Chronic. Currently managed with levothyroxine. He appears to be doing well at this time with no reported symptoms. Labs are due today for monitoring.  Plan: - Will order labs to ensure dosing is appropriate - If changes need to be made to your medication dose, I will let you know.  - Plan to follow-up in 6 months or sooner if needed.

## 2022-10-13 NOTE — Assessment & Plan Note (Signed)
Chronic. Lipids stable. No alarm symptoms present at this time. LDL Goal < 100. Controlled with  atorvastatin .  Plan: Continue current lipid-lowering therapy. orders written for new lab studies as appropriate; see orders Recommend Strict diet and exercise Diet and exercise recommendations provided.  Follow-up in 6months or sooner based on lab findings as appropriate.

## 2022-10-13 NOTE — Telephone Encounter (Signed)
Pt left message that he had CPE last week & needed refills on his meds,  he is now out,  said he thinks she was waiting on blood work & he said it was back.  Please send into his pharmacy

## 2022-10-13 NOTE — Assessment & Plan Note (Signed)
Chronic. Stable. No symptoms or concerns present.  Plan: - Continue current therapy

## 2022-10-14 MED ORDER — ZEPBOUND 2.5 MG/0.5ML ~~LOC~~ SOAJ
2.5000 mg | SUBCUTANEOUS | 0 refills | Status: DC
Start: 2022-10-14 — End: 2023-04-14

## 2022-10-15 NOTE — Telephone Encounter (Signed)
done

## 2022-10-16 ENCOUNTER — Telehealth: Payer: Self-pay

## 2022-10-16 NOTE — Telephone Encounter (Signed)
KeyAllyson Sabal PA Case ID #: [id number] Rx #: [id number] Status: sent iconSent to Plan today Drug: Zepbound 2.5MG /0.5ML pen-injectors Form: [facility] Electronic Prior Authorization Form (2017 NCPDP)

## 2022-10-18 DIAGNOSIS — Z23 Encounter for immunization: Secondary | ICD-10-CM | POA: Diagnosis not present

## 2022-10-19 NOTE — Telephone Encounter (Signed)
Pt notified via MyChart. Approval faxed to pharmacy.

## 2022-10-19 NOTE — Telephone Encounter (Signed)
Key: [id number] PA Case ID #: [id number] Rx #: [id number] Outcome: Approved on September 20 by [facility] 2017 NCPDP Request Reference Number: [id number].   ZEPBOUND INJ 2.5MG  is approved through 04/15/2023. Your patient may now fill this prescription and it will be covered.  Authorization Expiration Date: 04/15/2023 Drug: Zepbound 2.5MG /0.5ML pen-injectors Form: [facility] Electronic Prior Authorization Form [phone] NCPDP)

## 2022-12-18 IMAGING — US US ABDOMEN LIMITED
1 series · 14 of 25 positions shown · non-contrast
Comparison: No priors.

CLINICAL DATA: 44-year-old male with history of acute onset of
abdominal pain. Weight loss.

EXAM:
ULTRASOUND ABDOMEN LIMITED RIGHT UPPER QUADRANT

[Series 1: us abdomen complete · 55 acquisitions, 14 frames shown]
[im 1/55]
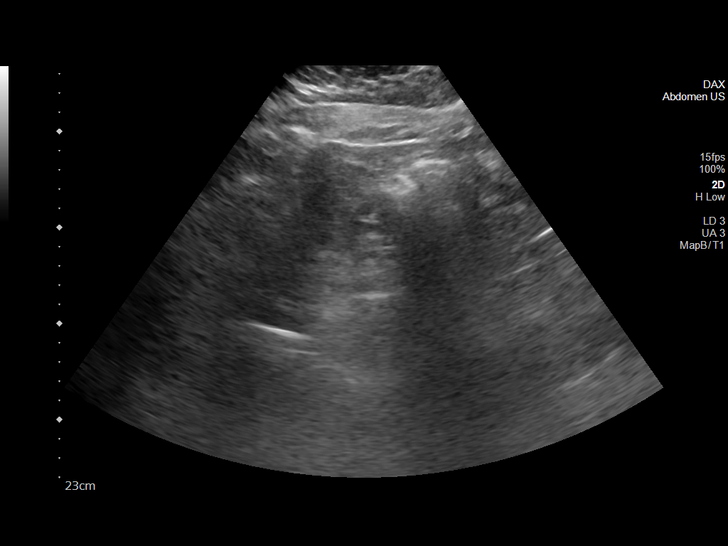
[im 5/55]
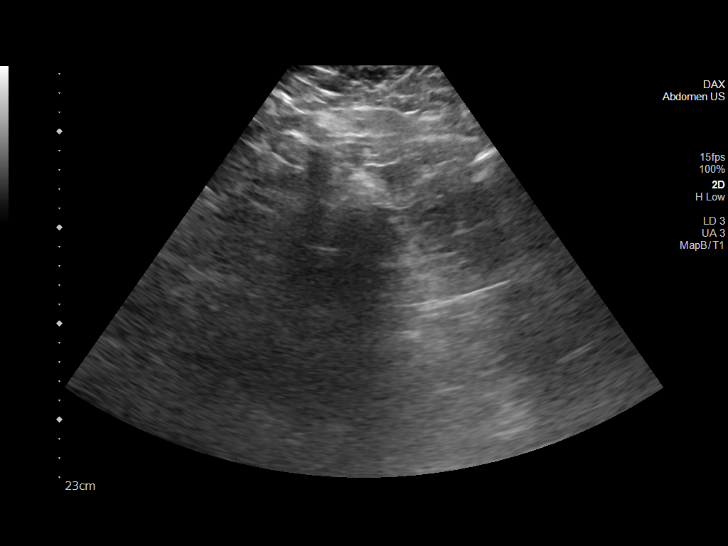
[im 10/55]
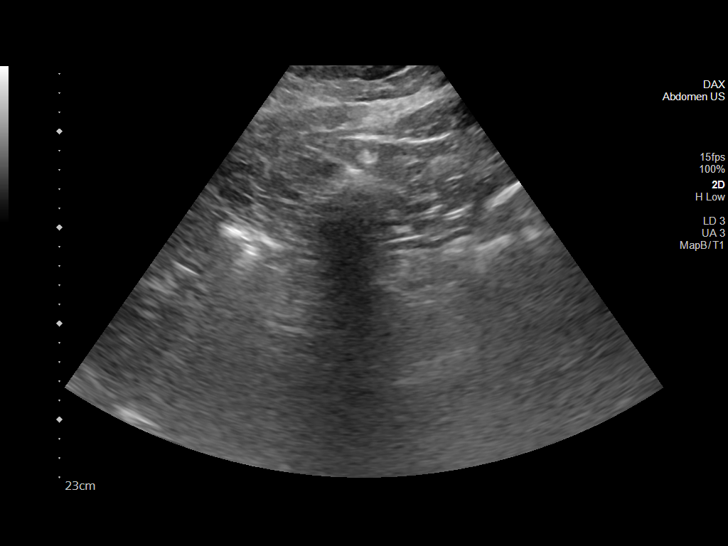
[im 14/55]
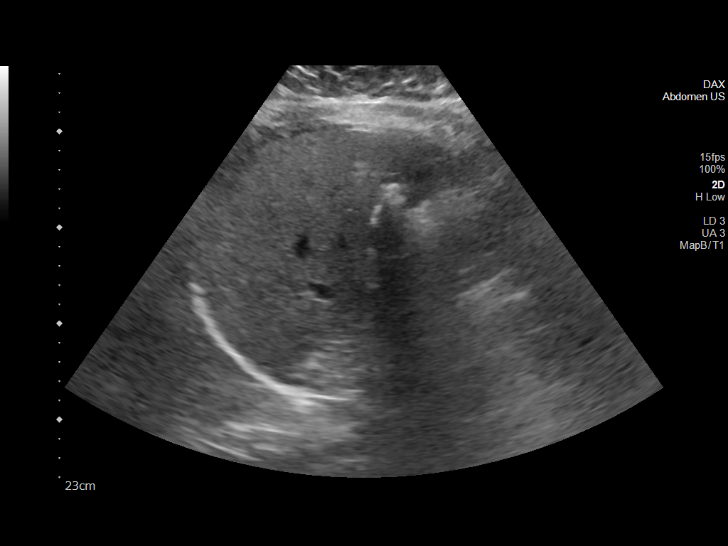
[im 19/55]
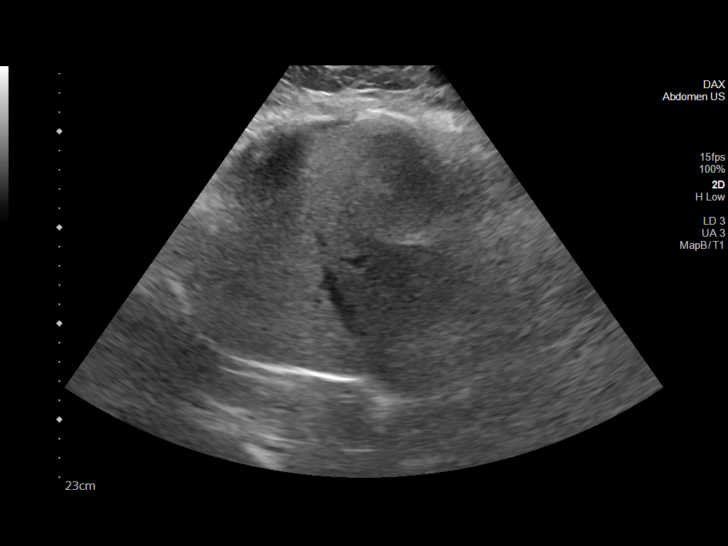
[im 21/55]
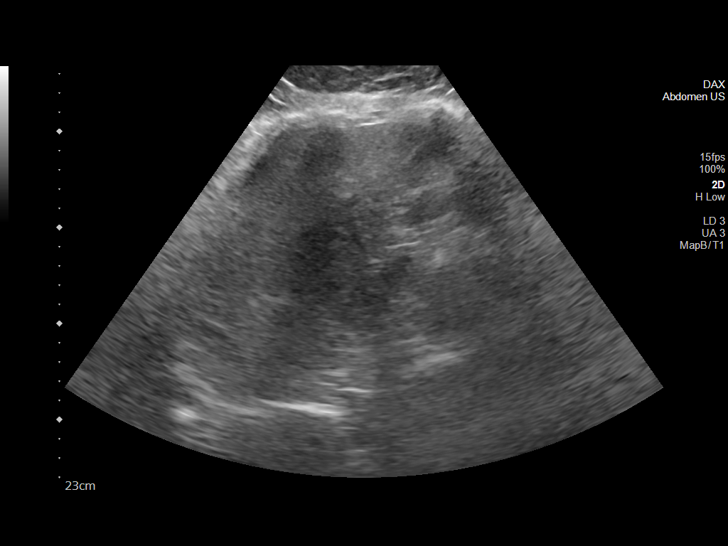
[im 25/55]
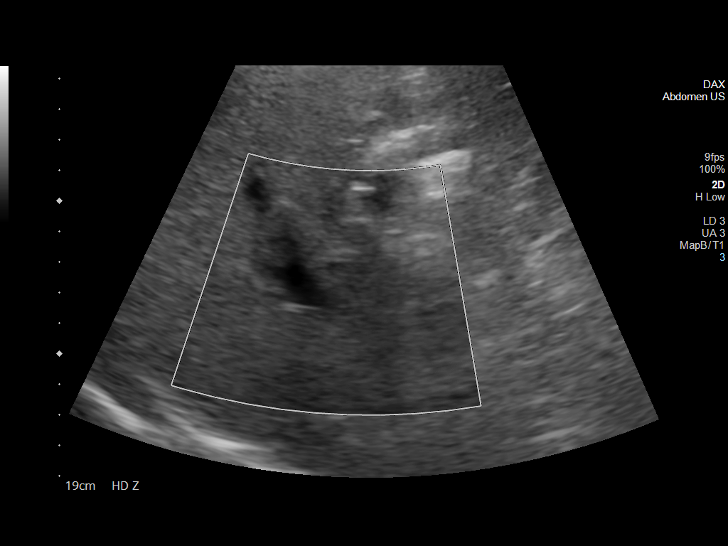
[im 30/55]
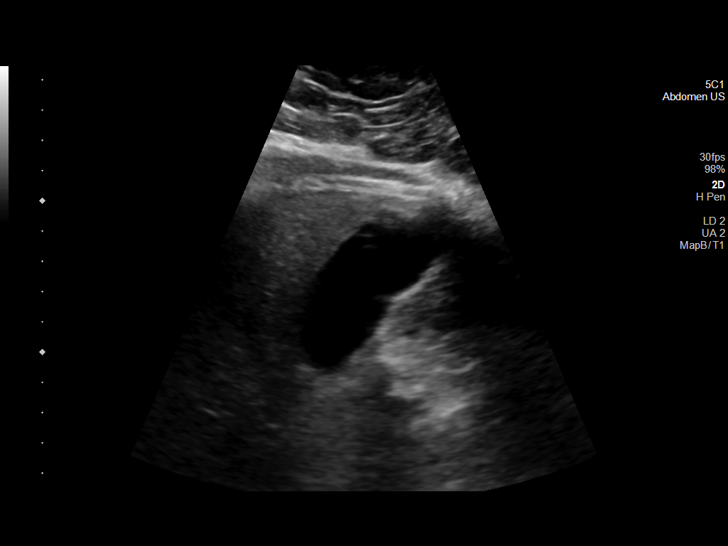
[im 34/55]
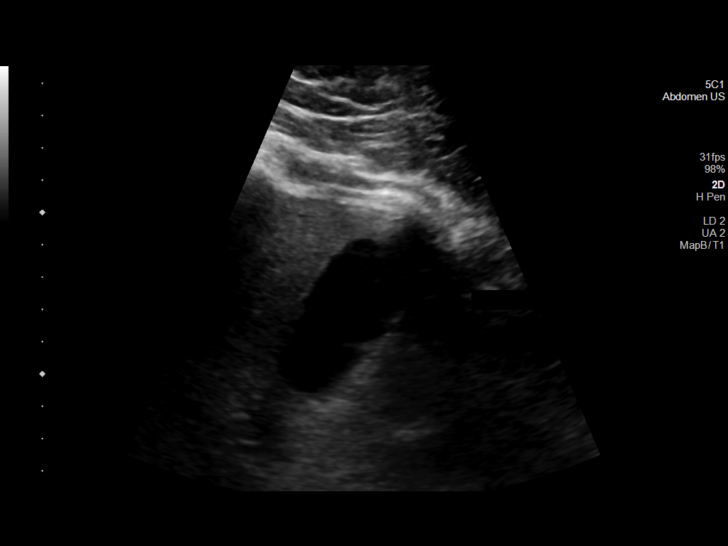
[im 37/55]
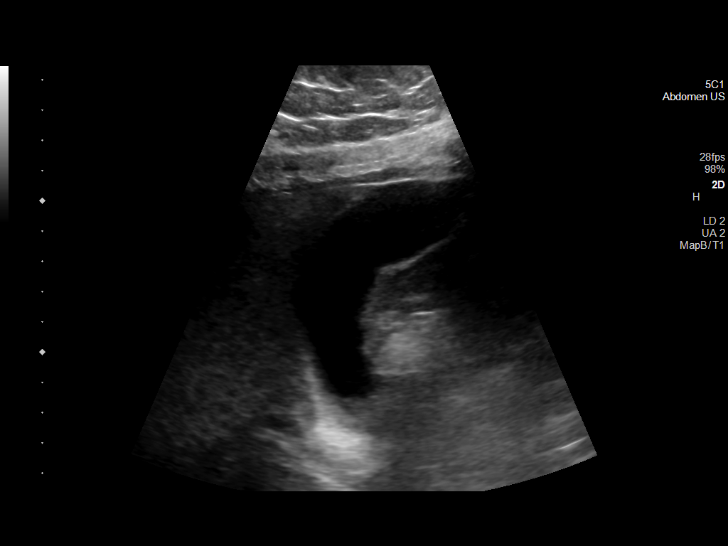
[im 41/55]
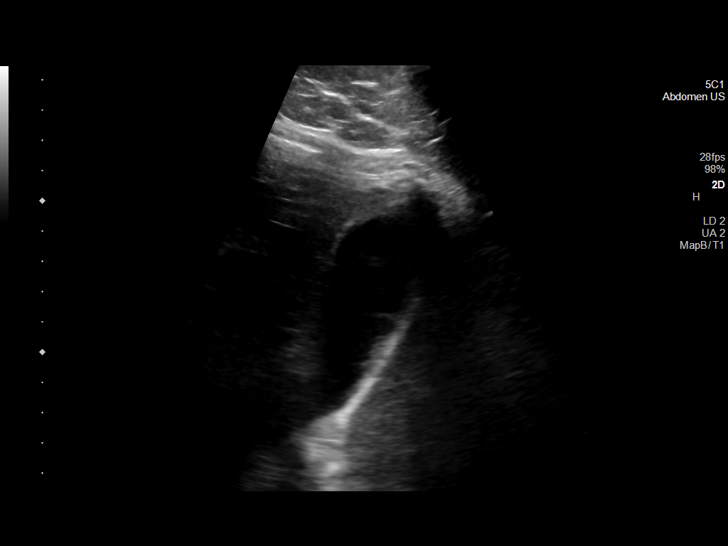
[im 46/55]
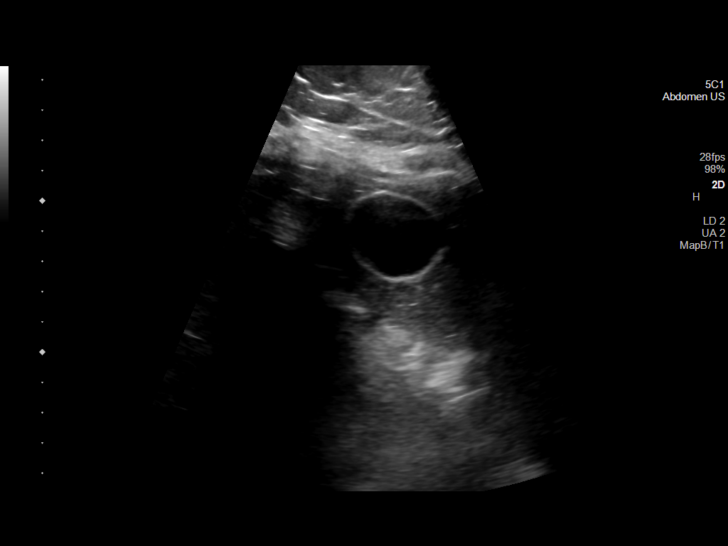
[im 50/55]
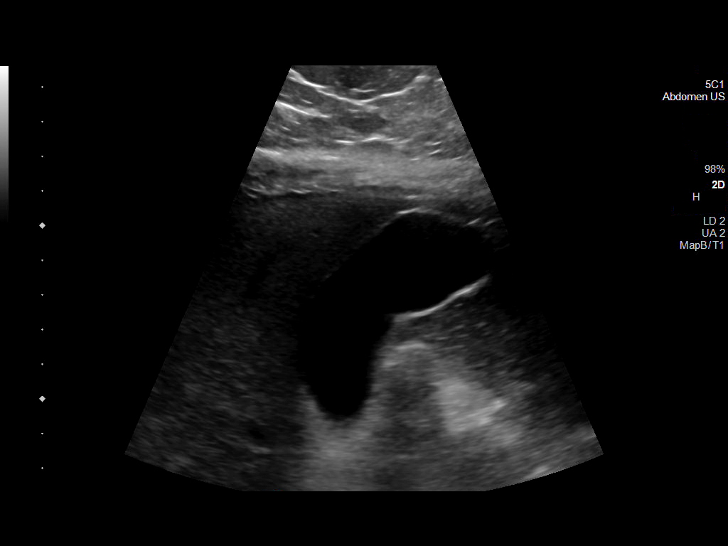
[im 55/55]
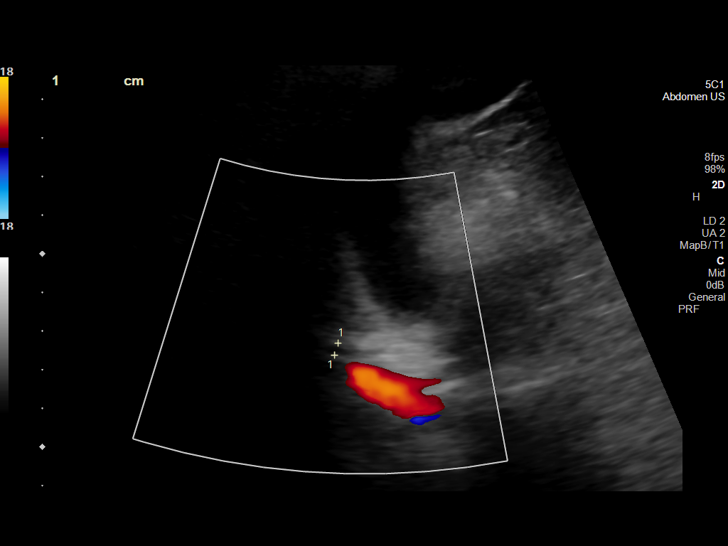

[14 of 25 positions shown; findings below may reference images not displayed]

FINDINGS: Gallbladder:

No gallstones or wall thickening visualized. No sonographic Murphy
sign noted by sonographer.

Common bile duct:

Diameter:

Liver:

No focal lesion identified. Within normal limits in parenchymal
echogenicity. Portal vein is patent on color Doppler imaging with
normal direction of blood flow towards the liver.

Other: None.
IMPRESSION: 1. No acute findings. Specifically, no gallstones or findings to
suggest an acute cholecystitis at this time.

## 2023-04-13 NOTE — Progress Notes (Unsigned)
 BP 116/80   Pulse 82   Ht 5\' 9"  (1.753 m)   Wt 181 lb 6.4 oz (82.3 kg)   BMI 26.79 kg/m    Subjective:    Patient ID: Howard Alexander, male    DOB: [birth year], 48 y.o.   MRN: [id number]  HPI: Howard Alexander is a 48 y.o. male presenting on 04/14/2023 for comprehensive medical examination.   History of Present Illness Howard Alexander is a 48 year old male who presents for a routine follow-up visit.  He has experienced significant weight loss, shedding 40 pounds over the past 18 months. This weight loss is attributed to increased attention to diet and exercise, primarily walking and using weights at the gym. He has not filled a prescription for Zepbound due to a high deductible insurance plan but continues to lose weight without it.  He has a history of hypothyroidism, which initially contributed to weight gain. Currently, there are no new thyroid-related symptoms such as dry skin, excessive sweating, or feeling hot all the time. He is on medication for hypothyroidism, though the specific medication and dosage were not discussed.  He has a past medical history of asthma but reports no recent asthma attacks or respiratory issues. He does not require a refill of his albuterol inhaler at this time.  He is focusing on improving his sleep quality, although he occasionally wakes up in the middle of the night. He has not yet tried any sleep aids like melatonin.  No recent shortness of breath, chest pain, dizziness, changes in bowel or bladder habits, or joint issues. No issues with hearing or vision, although he has an eye doctor appointment scheduled for later today. He mentions a past diagnosis of fatty liver in his twenties, but with his current weight loss and exercise regimen, he believes it is no longer present.  Pertinent items are noted in HPI.  HEALTH MAINTENANCE DUE: STI Testing HM Status: was declined  T-dap: Given today Pnuemonia: Not applicable- patient does NOT have diabetes or  COPD  Most Recent Depression Screen:     04/14/2023    8:49 AM 10/08/2022    9:39 AM 10/01/2021    8:15 AM 07/10/2021   10:43 AM 05/29/2021    8:39 AM  Depression screen PHQ 2/9  Decreased Interest 0 0 0 0 0  Down, Depressed, Hopeless 0 0 0 0 0  PHQ - 2 Score 0 0 0 0 0  Altered sleeping   0    Tired, decreased energy   1    Change in appetite   0    Feeling bad or failure about yourself    0    Trouble concentrating   0    Moving slowly or fidgety/restless   0    Suicidal thoughts   0    PHQ-9 Score   1    Difficult doing work/chores   Not difficult at all     Most Recent Anxiety Screen:     No data to display         Most Recent Falls Screen:    04/14/2023    8:49 AM 10/08/2022    9:39 AM 10/01/2021    8:09 AM 07/10/2021   10:43 AM 05/29/2021    8:39 AM  Fall Risk   Falls in the past year? 0 0 0 0 0  Number falls in past yr: 0 0 0 0 0  Injury with Fall? 0 0 0 0 0  Risk for fall due  to : No Fall Risks No Fall Risks No Fall Risks No Fall Risks No Fall Risks  Follow up Falls evaluation completed Falls evaluation completed Falls evaluation completed;Education provided Falls evaluation completed Education provided;Falls evaluation completed    Past medical history, surgical history, medications, allergies, family history and social history reviewed with patient today and changes made to appropriate areas of the chart.  Past Medical History:  Past Medical History:  Diagnosis Date   Abnormal weight loss 08/18/2022   Allergy    Seasonal/pollen   Asthma    Encounter for annual physical exam 04/14/2023   Family history of diabetes mellitus 03/31/2021   Hypercholesterolemia    Hypothyroidism due to Hashimoto's thyroiditis 03/31/2021   Left nephrolithiasis 07/24/2021   Non-traumatic rhabdomyolysis 07/24/2021   Medications:  Current Outpatient Medications on File Prior to Visit  Medication Sig   albuterol (VENTOLIN HFA) 108 (90 Base) MCG/ACT inhaler Inhale 2 puffs into the  lungs every 6 (six) hours as needed for wheezing or shortness of breath.   atorvastatin (LIPITOR) 10 MG tablet Take 1 tablet (10 mg total) by mouth daily.   Cholecalciferol (VITAMIN D-3) 125 MCG (5000 UT) TABS Take 5,000 Units by mouth daily.   levothyroxine (SYNTHROID) 125 MCG tablet Take 1 tablet (125 mcg total) by mouth daily.   montelukast (SINGULAIR) 10 MG tablet TAKE 1 TABLET BY MOUTH EVERY EVENING   No current facility-administered medications on file prior to visit.   Surgical History:  Past Surgical History:  Procedure Laterality Date   CYSTOSCOPY WITH RETROGRADE PYELOGRAM, URETEROSCOPY AND STENT PLACEMENT Left 07/03/2021   Procedure: CYSTOSCOPY WITH RETROGRADE PYELOGRAM, URETEROSCOPY AND STENT PLACEMENT;  Surgeon: Malen Gauze, MD;  Location: [facility];  Service: Urology;  Laterality: Left;   STONE EXTRACTION WITH BASKET Left 07/03/2021   Procedure: STONE EXTRACTION WITH BASKET;  Surgeon: Malen Gauze, MD;  Location: [facility];  Service: Urology;  Laterality: Left;   Allergies:  No Known Allergies Social History:  Social History   Socioeconomic History   Marital status: Married    Spouse name: Not on file   Number of children: Not on file   Years of education: Not on file   Highest education level: Bachelor's degree (e.g., BA, AB, BS)  Occupational History   Not on file  Tobacco Use   Smoking status: Never    Passive exposure: Never   Smokeless tobacco: Never   Tobacco comments:    Never started  Vaping Use   Vaping status: Never Used  Substance and Sexual Activity   Alcohol use: Not Currently    Alcohol/week: 1.0 standard drink of alcohol    Types: 1 Standard drinks or equivalent per week    Comment: Social only   Drug use: Never   Sexual activity: Yes    Birth control/protection: Surgical    Comment: Vasectomy  Other Topics Concern   Not on file  Social History Narrative   Not on file   Social Drivers of Health   Financial Resource Strain: Low Risk   (04/12/2023)   Overall Financial Resource Strain (CARDIA)    Difficulty of Paying Living Expenses: Not very hard  Food Insecurity: No Food Insecurity (04/12/2023)   Hunger Vital Sign    Worried About Running Out of Food in the Last Year: Never true    Ran Out of Food in the Last Year: Never true  Transportation Needs: No Transportation Needs (04/12/2023)   PRAPARE - Administrator, Civil Service (Medical):  No    Lack of Transportation (Non-Medical): No  Physical Activity: Insufficiently Active (04/12/2023)   Exercise Vital Sign    Days of Exercise per Week: 3 days    Minutes of Exercise per Session: 40 min  Stress: No Stress Concern Present (04/12/2023)   [employer] of Occupational Health - Occupational Stress Questionnaire    Feeling of Stress : Only a little  Social Connections: Moderately Integrated (04/12/2023)   Social Connection and Isolation Panel [NHANES]    Frequency of Communication with Friends and Family: Twice a week    Frequency of Social Gatherings with Friends and Family: Once a week    Attends Religious Services: More than 4 times per year    Active Member of [employer] or Organizations: No    Attends Engineer, structural: Not on file    Marital Status: Married  Catering manager Violence: Not on file   Social History   Tobacco Use  Smoking Status Never   Passive exposure: Never  Smokeless Tobacco Never  Tobacco Comments   Never started   Social History   Substance and Sexual Activity  Alcohol Use Not Currently   Alcohol/week: 1.0 standard drink of alcohol   Types: 1 Standard drinks or equivalent per week   Comment: Social only   Family History:  Family History  Problem Relation Age of Onset   Cancer Father    ADD / ADHD Son        Objective:    BP 116/80   Pulse 82   Ht 5\' 9"  (1.753 m)   Wt 181 lb 6.4 oz (82.3 kg)   BMI 26.79 kg/m   Wt Readings from Last 3 Encounters:  04/14/23 181 lb 6.4 oz (82.3 kg)  10/08/22 202 lb  9.6 oz (91.9 kg)  10/01/21 207 lb 3.2 oz (94 kg)    Physical Exam Vitals and nursing note reviewed.  Constitutional:      Appearance: Normal appearance.  HENT:     Head: Normocephalic and atraumatic.     Right Ear: Hearing, tympanic membrane, ear canal and external ear normal.     Left Ear: Hearing, tympanic membrane, ear canal and external ear normal.     Nose: Nose normal.     Right Sinus: No maxillary sinus tenderness or frontal sinus tenderness.     Left Sinus: No maxillary sinus tenderness or frontal sinus tenderness.     Mouth/Throat:     Lips: Pink.     Mouth: Mucous membranes are moist.     Pharynx: Oropharynx is clear.  Eyes:     General: Lids are normal. Vision grossly intact.     Extraocular Movements: Extraocular movements intact.     Pupils: Pupils are equal, round, and reactive to light.     Funduscopic exam:    Right eye: No hemorrhage. Red reflex present.        Left eye: No hemorrhage. Red reflex present.    Visual Fields: Right eye visual fields normal and left eye visual fields normal.  Neck:     Thyroid: No thyromegaly.     Vascular: No carotid bruit or JVD.  Cardiovascular:     Rate and Rhythm: Normal rate and regular rhythm.     Chest Wall: PMI is not displaced.     Pulses: Normal pulses.          Dorsalis pedis pulses are 2+ on the right side and 2+ on the left side.  Posterior tibial pulses are 2+ on the right side and 2+ on the left side.     Heart sounds: Normal heart sounds. No murmur heard. Pulmonary:     Effort: Pulmonary effort is normal. No respiratory distress.     Breath sounds: Normal breath sounds.  Chest:  Breasts:    Breasts are symmetrical.  Abdominal:     General: Bowel sounds are normal. There is no distension or abdominal bruit.     Palpations: Abdomen is soft. There is no hepatomegaly, splenomegaly or mass.     Tenderness: There is no abdominal tenderness. There is no right CVA tenderness, left CVA tenderness, guarding or  rebound.  Musculoskeletal:        General: Normal range of motion.     Cervical back: Full passive range of motion without pain and neck supple. No tenderness. No spinous process tenderness or muscular tenderness.     Right lower leg: No edema.     Left lower leg: No edema.  Feet:     Right foot:     Toenail Condition: Right toenails are normal.     Left foot:     Toenail Condition: Left toenails are normal.  Lymphadenopathy:     Cervical: No cervical adenopathy.     Upper Body:     Right upper body: No supraclavicular adenopathy.     Left upper body: No supraclavicular adenopathy.  Skin:    General: Skin is warm and dry.     Capillary Refill: Capillary refill takes less than 2 seconds.     Nails: There is no clubbing.  Neurological:     General: No focal deficit present.     Mental Status: He is alert and oriented to person, place, and time.     Cranial Nerves: No cranial nerve deficit.     Sensory: Sensation is intact. No sensory deficit.     Motor: Motor function is intact. No weakness.     Coordination: Coordination is intact. Coordination normal.     Gait: Gait is intact. Gait normal.  Psychiatric:        Attention and Perception: Attention normal.        Mood and Affect: Mood normal.        Speech: Speech normal.        Behavior: Behavior normal. Behavior is cooperative.        Cognition and Memory: Cognition and memory normal.      Results for orders placed or performed in visit on 10/08/22  Lipid Panel   Collection Time: 10/08/22 10:40 AM  Result Value Ref Range   Cholesterol, Total 152 100 - 199 mg/dL   Triglycerides 696 0 - 149 mg/dL   HDL 49 >29 mg/dL   VLDL Cholesterol Cal 22 5 - 40 mg/dL   LDL Chol Calc (NIH) 81 0 - 99 mg/dL   Chol/HDL Ratio 3.1 0.0 - 5.0 ratio  TSH   Collection Time: 10/08/22 10:40 AM  Result Value Ref Range   TSH 0.925 0.450 - 4.500 uIU/mL  T4, Free   Collection Time: 10/08/22 10:40 AM  Result Value Ref Range   Free T4 1.64 0.82 -  1.77 ng/dL  Testosterone, Free, Total, SHBG   Collection Time: 10/08/22 10:40 AM  Result Value Ref Range   Testosterone 479 264 - 916 ng/dL   Testosterone, Free 52.8 6.8 - 21.5 pg/mL   Sex Hormone Binding 33.2 16.5 - 55.9 nmol/L      Assessment & Plan:  Problem List Items Addressed This Visit     Hypothyroidism due to Hashimoto's thyroiditis   No new symptoms such as dry skin or excessive sweating. Weight loss may necessitate thyroid medication adjustment pending blood work results. - Check thyroid function tests - If changes needed to dosing, will notify after labs return.       Relevant Orders   CMP14+EGFR   TSH   T4, free   Mixed hyperlipidemia   Currently managed with atorvastatin 10mg . Significant changes to diet and exercise with 40lb weight loss. Will monitor to determine if same dose is appropriate.  - Labs today - If dose reduction needed, will notify patient after lab results.  - Continue with diet and exercise.       Relevant Orders   CBC with Differential/Platelet   CMP14+EGFR   Hemoglobin A1c   BMI 32.0-32.9,adult   BMI down to 26. Excellent weight loss success with dietary changes and daily routine exercise regimen. 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 - Work on [employer], at this time your weight is great.       Relevant Orders   CBC with Differential/Platelet   CMP14+EGFR   Hemoglobin A1c   Fatty liver   Previous diagnosis in 20's. Significant weight loss with diet and exercise changes. Will monitor LFT's today. Suspect this is resolved with his efforts at weight, control of blood sugars and control of lipids.  - Keep up the great work       Encounter for annual physical exam - Primary   CPE completed today. Review of HM activities and recommendations discussed and provided on AVS. Anticipatory guidance, diet, and exercise recommendations provided. Medications, allergies, and hx reviewed and updated as  necessary. Orders placed as listed below.  Plan: - Labs ordered. Will make changes as necessary based on results.  - I will review these results and send recommendations via MyChart or a telephone call.  - F/U with CPE in 1 year or sooner for acute/chronic health needs as directed.        Vitamin D deficiency   Repeat labs today. No concerns      Relevant Orders   TSH   VITAMIN D 25 Hydroxy (Vit-D Deficiency, Fractures)   Asthma, well controlled   Well controlled with minimal need for inhaler use. No alarm symptoms. Notify if new or worsening symptoms develop or inhaler is needed. OK to refill as needed.      RESOLVED: Mild intermittent asthma without complication   Relevant Orders   CBC with Differential/Platelet   RESOLVED: Prediabetes   Other Visit Diagnoses       Need for Tdap vaccination          Follow-up changed to annually as long as chronic conditions remain well controlled. Patient praised for his efforts and success.  Follow up plan: NEXT PREVENTATIVE PHYSICAL DUE IN 1 YEAR. Return in about 1 year (around 04/13/2024) for CPE.  LABORATORY TESTING:  Health maintenance labs ordered today, if applicable.    PATIENT COUNSELING:   For all adult patients, I recommend A well balanced diet low in saturated fats, cholesterol, and moderation in carbohydrates.  This can be as simple as monitoring portion sizes and cutting back on sugary beverages such as soda and juice to start with.    Daily water consumption of at least 64 ounces.  Physical activity at least 180 minutes per week, if just starting out.  This can  be as simple as taking the stairs instead of the elevator and walking 2-3 laps around the office  purposefully every day.   STD protection, partner selection, and regular testing if high risk.  Limited consumption of alcoholic beverages if alcohol is consumed. For men, I recommend no more than 14 alcoholic beverages per week, spread out throughout the week  (max 2 per day). Avoid "binge" drinking or consuming large quantities of alcohol in one setting.  Please let me know if you feel you may need help with reduction or quitting alcohol consumption.   Avoidance of nicotine, if used. Please let me know if you feel you may need help with reduction or quitting nicotine use.   Daily mental health attention. This can be in the form of 5 minute daily meditation, prayer, journaling, yoga, reflection, etc.  Purposeful attention to your emotions and mental state can significantly improve your overall wellbeing and Health.  Please know that I am here to help you with all of your health care goals and am happy to work with you to find a solution that works best for you.  The greatest advice I have received with any changes in life are to take it one step at a time, that even means if all you can focus on is the next 60 seconds, then do that and celebrate your victories.  With any changes in life, you will have set backs, and that is OK. The important thing to remember is, if you have a set back, it is not a failure, it is an opportunity to try again!  Health Maintenance Recommendations Screening Testing Mammogram Every 1 -2 years based on history and risk factors Starting at age 74 Pap Smear Ages 21-39 every 3 years Ages 27-65 every 5 years with HPV testing More frequent testing may be required based on results and history Colon Cancer Screening Every 1-10 years based on test performed, risk factors, and history Starting at age 39 Bone Density Screening Every 2-10 years based on history Starting at age 42 for women Recommendations for men differ based on medication usage, history, and risk factors AAA Screening One time ultrasound Men 88-60 years old who have every smoked Lung Cancer Screening Low Dose Lung CT every 12 months Age 11-80 years with a 30 pack-year smoking history who still smoke or who have quit within the last 15  years   Screening Labs Routine  Labs: Complete Blood Count (CBC), Complete Metabolic Panel (CMP), Cholesterol (Lipid Panel) Every 6-12 months based on history and medications May be recommended more frequently based on current conditions or previous results Hemoglobin A1c Lab Every 3-12 months based on history and previous results Starting at age 57 or earlier with diagnosis of diabetes, high cholesterol, BMI >26, and/or risk factors Frequent monitoring for patients with diabetes to ensure blood sugar control Thyroid Panel (TSH) Every 6 months based on history, symptoms, and risk factors May be repeated more often if on medication HIV One time testing for all patients 62 and older May be repeated more frequently for patients with increased risk factors or exposure Hepatitis C One time testing for all patients 65 and older May be repeated more frequently for patients with increased risk factors or exposure Gonorrhea, Chlamydia Every 12 months for all sexually active persons 13-24 years Additional monitoring may be recommended for those who are considered high risk or who have symptoms Every 12 months for any woman on birth control, regardless of sexual activity PSA Men 65-54  years old with risk factors Additional screening may be recommended from age 45-69 based on risk factors, symptoms, and history  Vaccine Recommendations Tetanus Booster All adults every 10 years Flu Vaccine All patients 6 months and older every year COVID Vaccine All patients 12 years and older Initial dosing with booster May recommend additional booster based on age and health history HPV Vaccine 2 doses all patients age 16-26 Dosing may be considered for patients over 26 Shingles Vaccine (Shingrix) 2 doses all adults 55 years and older Pneumonia (Pneumovax 35) All adults 65 years and older May recommend earlier dosing based on health history One year apart from Prevnar 13 Pneumonia (Prevnar 27) All  adults 65 years and older Dosed 1 year after Pneumovax 23 Pneumonia (Prevnar 20) One time alternative to the two dosing of 13 and 23 For all adults with initial dose of 23, 20 is recommended 1 year later For all adults with initial dose of 13, 23 is still recommended as second option 1 year later  Additional Screening, Testing, and Vaccinations may be recommended on an individualized basis based on family history, health history, risk factors, and/or exposure.

## 2023-04-14 ENCOUNTER — Encounter: Payer: Self-pay | Admitting: Nurse Practitioner

## 2023-04-14 ENCOUNTER — Ambulatory Visit: Payer: BC Managed Care – PPO | Admitting: Nurse Practitioner

## 2023-04-14 VITALS — BP 116/80 | HR 82 | Ht 69.0 in | Wt 181.4 lb

## 2023-04-14 DIAGNOSIS — E782 Mixed hyperlipidemia: Secondary | ICD-10-CM

## 2023-04-14 DIAGNOSIS — E063 Autoimmune thyroiditis: Secondary | ICD-10-CM | POA: Diagnosis not present

## 2023-04-14 DIAGNOSIS — R739 Hyperglycemia, unspecified: Secondary | ICD-10-CM

## 2023-04-14 DIAGNOSIS — E559 Vitamin D deficiency, unspecified: Secondary | ICD-10-CM | POA: Diagnosis not present

## 2023-04-14 DIAGNOSIS — Z6832 Body mass index (BMI) 32.0-32.9, adult: Secondary | ICD-10-CM

## 2023-04-14 DIAGNOSIS — J45909 Unspecified asthma, uncomplicated: Secondary | ICD-10-CM | POA: Insufficient documentation

## 2023-04-14 DIAGNOSIS — Z8719 Personal history of other diseases of the digestive system: Secondary | ICD-10-CM

## 2023-04-14 DIAGNOSIS — Z23 Encounter for immunization: Secondary | ICD-10-CM

## 2023-04-14 DIAGNOSIS — Z0000 Encounter for general adult medical examination without abnormal findings: Secondary | ICD-10-CM

## 2023-04-14 DIAGNOSIS — J4520 Mild intermittent asthma, uncomplicated: Secondary | ICD-10-CM | POA: Diagnosis not present

## 2023-04-14 HISTORY — DX: Encounter for general adult medical examination without abnormal findings: Z00.00

## 2023-04-14 NOTE — Assessment & Plan Note (Signed)
 Repeat labs today. No concerns

## 2023-04-14 NOTE — Patient Instructions (Addendum)
 You look fabulous! Congratulations on your weight loss, I am so proud of you! I recommend working on strength training and maintaining at this point. Keep up the great work!!  Follow-Up: We will plan to follow-up in 6 months to monitor your thyroid, cholesterol, blood sugar monitoring, and weight. Your blood sugars have been normal in the last year, but we want to make sure they stay that way.   Labs today: Thyroid (TSH, T4F) 3 month blood sugar (A1c) Kidneys, liver, electrolytes, proteins (CMP) Red and white blood cell counts (CBC) Vitamin D   Medications: Atorvastatin 10mg - 1 year sent 09/17 - if we can stop this based on your labs, I will let you know. Fingers crossed! Montelukast 10mg - 1 year sent 09/17 Levothyroxine - 1 year sent 09/17. If your labs show a need for change, I will reach out to you to let you know.   If you need albuterol refills, please let me know.   Health Maintenance Recommendations Tetanus booster every 10 years - This was given today. You will be covered for 10 years.   Try melatonin 3-5mg  when you wake up in the middle of the night to help you fall back asleep. If this gets worse or you have sleeplessness more often then please let me know.

## 2023-04-14 NOTE — Assessment & Plan Note (Signed)
 Currently managed with atorvastatin 10mg . Significant changes to diet and exercise with 40lb weight loss. Will monitor to determine if same dose is appropriate.  - Labs today - If dose reduction needed, will notify patient after lab results.  - Continue with diet and exercise.

## 2023-04-14 NOTE — Assessment & Plan Note (Signed)
 Well controlled with minimal need for inhaler use. No alarm symptoms. Notify if new or worsening symptoms develop or inhaler is needed. OK to refill as needed.

## 2023-04-14 NOTE — Assessment & Plan Note (Signed)
 BMI down to 26. Excellent weight loss success with dietary changes and daily routine exercise regimen. 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 - Work on [employer], at this time your weight is great.

## 2023-04-14 NOTE — Assessment & Plan Note (Signed)
 No new symptoms such as dry skin or excessive sweating. Weight loss may necessitate thyroid medication adjustment pending blood work results. - Check thyroid function tests - If changes needed to dosing, will notify after labs return.

## 2023-04-14 NOTE — Assessment & Plan Note (Signed)

## 2023-04-14 NOTE — Assessment & Plan Note (Signed)
 Previous diagnosis in 20's. Significant weight loss with diet and exercise changes. Will monitor LFT's today. Suspect this is resolved with his efforts at weight, control of blood sugars and control of lipids.  - Keep up the great work

## 2023-04-15 LAB — CMP14+EGFR
ALT: 21 IU/L (ref 0–44)
AST: 20 IU/L (ref 0–40)
Albumin: 4.6 g/dL (ref 4.1–5.1)
Alkaline Phosphatase: 84 IU/L (ref 44–121)
BUN/Creatinine Ratio: 18 (ref 9–20)
BUN: 18 mg/dL (ref 6–24)
Bilirubin Total: 0.4 mg/dL (ref 0.0–1.2)
CO2: 23 mmol/L (ref 20–29)
Calcium: 9.7 mg/dL (ref 8.7–10.2)
Chloride: 103 mmol/L (ref 96–106)
Creatinine, Ser: 1.02 mg/dL (ref 0.76–1.27)
Globulin, Total: 2.4 g/dL (ref 1.5–4.5)
Glucose: 93 mg/dL (ref 70–99)
Potassium: 4.6 mmol/L (ref 3.5–5.2)
Sodium: 140 mmol/L (ref 134–144)
Total Protein: 7 g/dL (ref 6.0–8.5)
eGFR: 91 mL/min/{1.73_m2} (ref >59)

## 2023-04-15 LAB — CBC WITH DIFFERENTIAL/PLATELET
Basophils Absolute: 0 10*3/uL (ref 0.0–0.2)
Basos: 1 %
EOS (ABSOLUTE): 0.1 10*3/uL (ref 0.0–0.4)
Eos: 1 %
Hematocrit: 44.1 % (ref 37.5–51.0)
Hemoglobin: 15 g/dL (ref 13.0–17.7)
Immature Grans (Abs): 0 10*3/uL (ref 0.0–0.1)
Immature Granulocytes: 0 %
Lymphocytes Absolute: 1.7 10*3/uL (ref 0.7–3.1)
Lymphs: 24 %
MCH: 30.7 pg (ref 26.6–33.0)
MCHC: 34 g/dL (ref 31.5–35.7)
MCV: 90 fL (ref 79–97)
Monocytes Absolute: 0.4 10*3/uL (ref 0.1–0.9)
Monocytes: 5 %
Neutrophils Absolute: 4.8 10*3/uL (ref 1.4–7.0)
Neutrophils: 69 %
Platelets: 185 10*3/uL (ref 150–450)
RBC: 4.89 x10E6/uL (ref 4.14–5.80)
RDW: 11.9 % (ref 11.6–15.4)
WBC: 7 10*3/uL (ref 3.4–10.8)

## 2023-04-15 LAB — VITAMIN D 25 HYDROXY (VIT D DEFICIENCY, FRACTURES): Vit D, 25-Hydroxy: 66 ng/mL (ref 30.0–100.0)

## 2023-04-15 LAB — HEMOGLOBIN A1C
Est. average glucose Bld gHb Est-mCnc: 105 mg/dL
Hgb A1c MFr Bld: 5.3 % (ref 4.8–5.6)

## 2023-04-15 LAB — T4, FREE: Free T4: 2.4 ng/dL — ABNORMAL HIGH (ref 0.82–1.77)

## 2023-04-15 LAB — TSH: TSH: 0.715 u[IU]/mL (ref 0.450–4.500)

## 2023-04-19 ENCOUNTER — Encounter: Payer: Self-pay | Admitting: Nurse Practitioner

## 2023-04-22 ENCOUNTER — Encounter: Payer: Self-pay | Admitting: Nurse Practitioner

## 2023-04-22 ENCOUNTER — Other Ambulatory Visit: Payer: Self-pay | Admitting: Nurse Practitioner

## 2023-04-22 DIAGNOSIS — E063 Autoimmune thyroiditis: Secondary | ICD-10-CM

## 2023-04-22 MED ORDER — LEVOTHYROXINE SODIUM 112 MCG PO TABS: 112.0000 ug | ORAL_TABLET | Freq: Every day | ORAL | 1 refills | Status: DC

## 2023-05-05 ENCOUNTER — Telehealth: Payer: Self-pay | Admitting: Internal Medicine

## 2023-05-05 NOTE — Telephone Encounter (Unsigned)
 Copied from CRM [id number]. Topic: Clinical - Request for Lab/Test Order >> May 05, 2023  3:47 PM Patsy Lager T wrote: Reason for CRM: patient called to request orders for labs to have his thyroid checked per provider wanted to see him in 8wks (May 30th)

## 2023-05-06 ENCOUNTER — Other Ambulatory Visit: Payer: Self-pay

## 2023-05-06 DIAGNOSIS — E063 Autoimmune thyroiditis: Secondary | ICD-10-CM

## 2023-06-29 ENCOUNTER — Other Ambulatory Visit: Payer: Self-pay | Admitting: *Deleted

## 2023-06-29 ENCOUNTER — Telehealth: Payer: Self-pay | Admitting: *Deleted

## 2023-06-29 DIAGNOSIS — E063 Autoimmune thyroiditis: Secondary | ICD-10-CM

## 2023-06-29 NOTE — Telephone Encounter (Signed)
 Copied from CRM [id number]. Topic: Clinical - Request for Lab/Test Order >> Jun 29, 2023 12:29 PM Howard Alexander wrote: Reason for CRM: The patient called stating he has moved to [facility] not far from [place] and would like to know if he can take his lab orders for TSH & freeT4 to a local [facility]? He says he was supposed to get this done around this time here but a lot has changed since he last spoke with his provider. Please assist patient further as it is a 6 hour drive and it would really help him out if he can get the blood work done there. He is also waiting for a new primary care provider there but they are scheduled out into the Fall and Winter.   Are you okay with me sending these orders to him to take and have done in MD?

## 2023-06-29 NOTE — Telephone Encounter (Signed)
 Emailed lab orders to patient. He said thank you!

## 2023-08-06 IMAGING — DX DG ABDOMEN 1V
2 series · 2 of 2 positions shown · non-contrast
Comparison: CT abdomen and pelvis with contrast 10/14/2020

CLINICAL DATA: Flat plate abdomen follow-up for kidney stones.

EXAM:
ABDOMEN - 1 VIEW

[abdomen kub (1 of 2)]
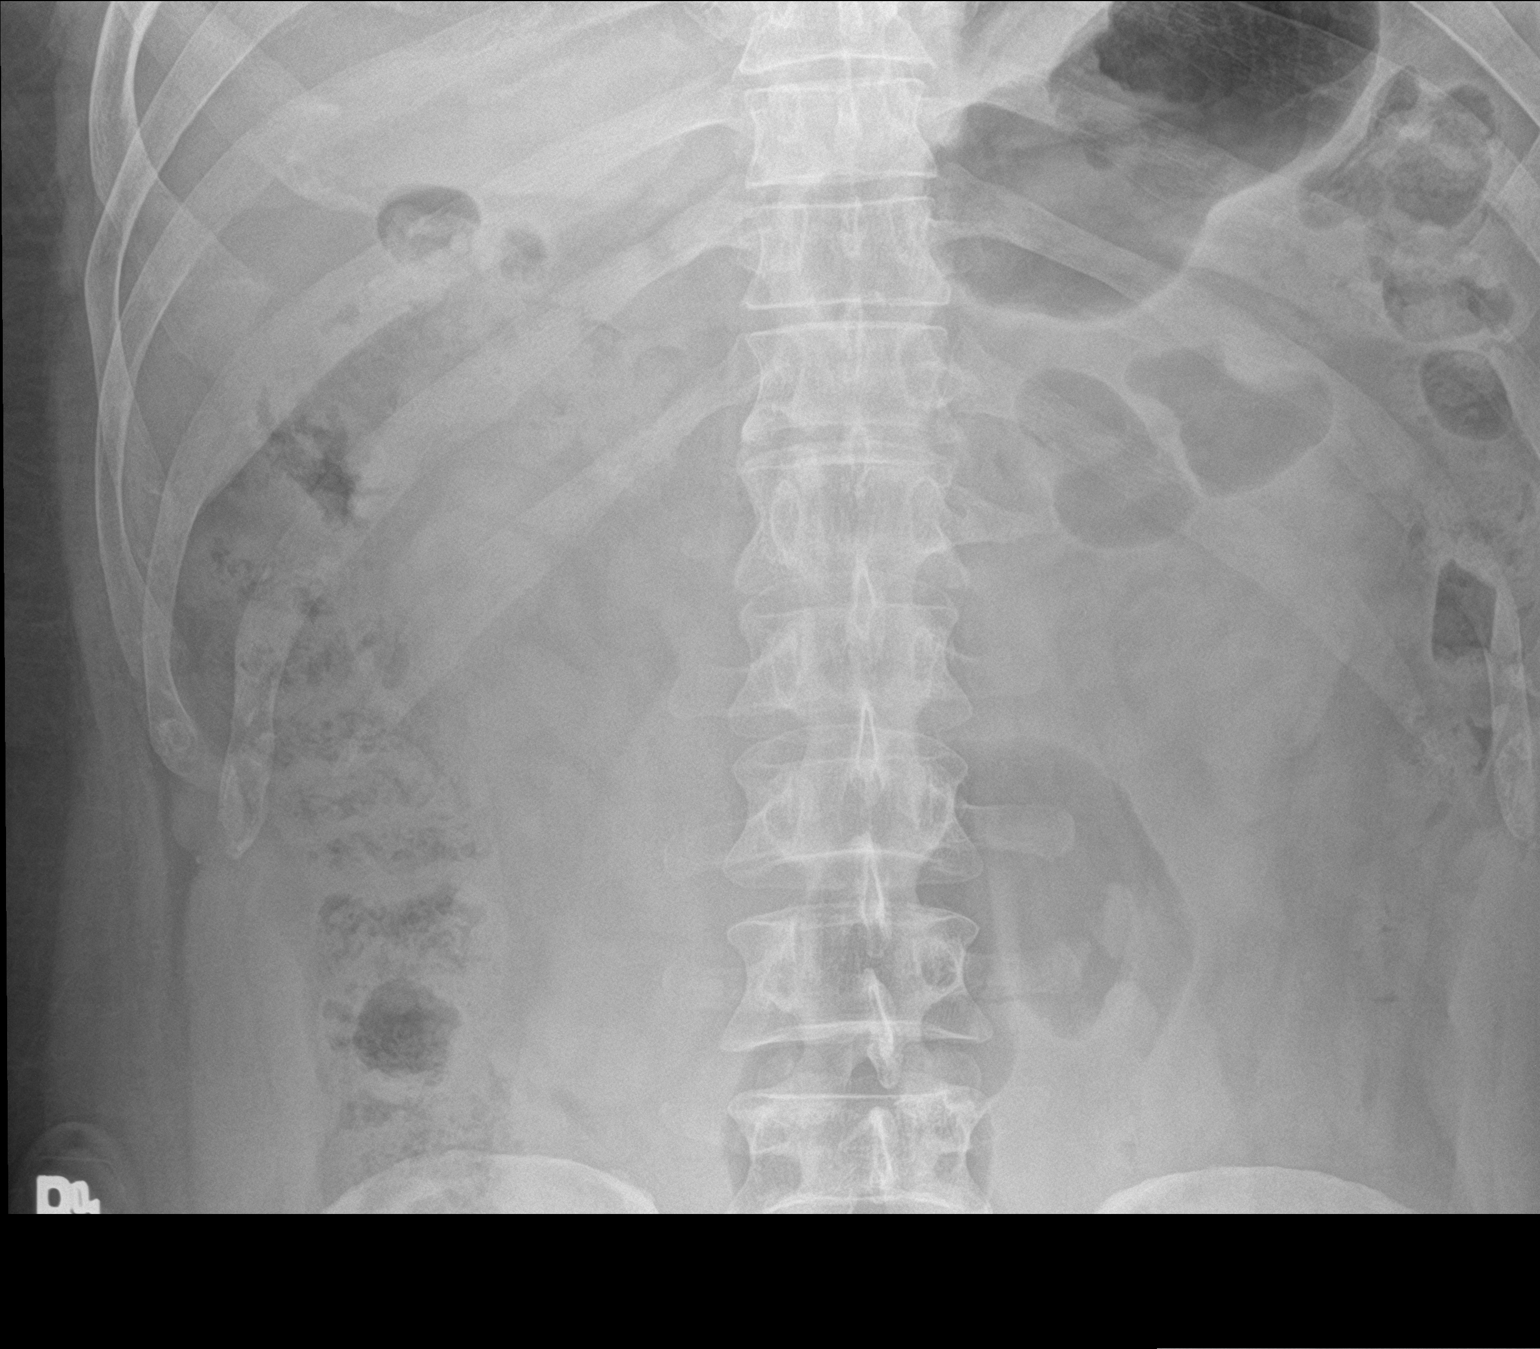

[abdomen kub (2 of 2)]
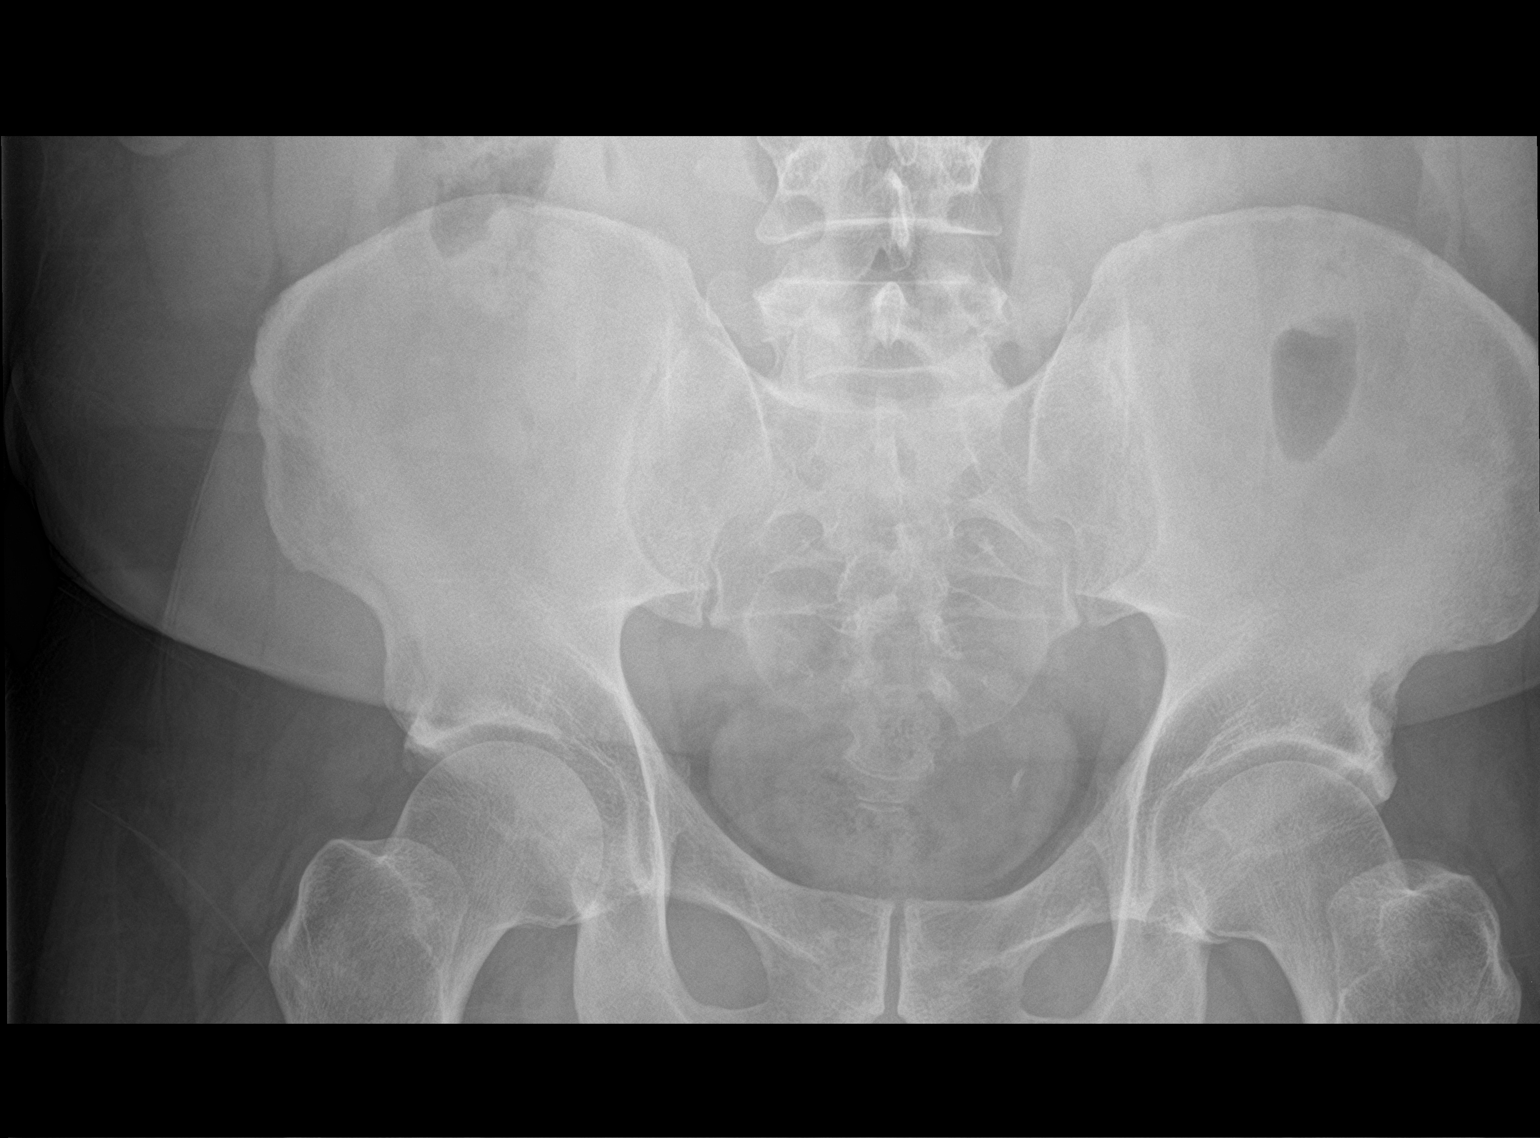

[2 of 2 positions shown; findings below may reference images not displayed]

FINDINGS: The bowel gas pattern is normal. There is least 1 punctate
nonobstructive caliceal stone in the inferior pole of the left
kidney, which was seen previously.

In the left hemipelvis at the level of the ischial spines, there is
new demonstration of a rod-like density or calcification measuring 6
mm in length and 2.5 mm width.

This is concerning for a UVJ stone and no similar abnormality was
seen on CT. There were no phleboliths in the pelvis.

Alternatively this could be a superimposed calcification in the soft
tissues.

The visceral shadows are stable as visualized, with exclusion of the
domes of the diaphragm from the study.

No acute osseous abnormality is seen.
IMPRESSION: 1. New demonstration of a rod-like 6 x 2.5 mm density or
calcification in the left hemipelvis in the plane of the UVJ. This
is concerning for a distal ureteral or UVJ calculus. Further imaging
could include CT if needed for confirmation.
2. At least 1 punctate caliceal stone appears present in the lower
pole left kidney. No other nephrolithiasis is visible
radiographically.
3. These results will be called to the ordering clinician or
representative by the radiology assistant, and communication
documented in the PACS or [REDACTED].

## 2023-08-30 ENCOUNTER — Telehealth: Payer: Self-pay

## 2023-08-30 NOTE — Telephone Encounter (Signed)
 Pt. Sent MyChart message.   Copied from CRM [id number]. Topic: Clinical - Request for Lab/Test Order >> Aug 30, 2023  1:33 PM Carlatta H wrote: Reason for CRM: Patient called to inquire about current lab orders//He would like to know if they can be done at any [facility]

## 2023-09-02 LAB — TSH+FREE T4
Free T4: 1.79 ng/dL — ABNORMAL HIGH (ref 0.82–1.77)
TSH: 2.05 u[IU]/mL (ref 0.450–4.500)

## 2023-09-07 ENCOUNTER — Ambulatory Visit: Payer: Self-pay | Admitting: Nurse Practitioner

## 2024-01-14 ENCOUNTER — Encounter: Payer: Self-pay | Admitting: Nurse Practitioner

## 2024-01-17 ENCOUNTER — Other Ambulatory Visit: Payer: Self-pay

## 2024-01-17 DIAGNOSIS — J4520 Mild intermittent asthma, uncomplicated: Secondary | ICD-10-CM

## 2024-01-17 DIAGNOSIS — Z130 Encounter for screening for diseases of the blood and blood-forming organs and certain disorders involving the immune mechanism: Secondary | ICD-10-CM

## 2024-01-17 DIAGNOSIS — E063 Autoimmune thyroiditis: Secondary | ICD-10-CM

## 2024-01-17 DIAGNOSIS — E782 Mixed hyperlipidemia: Secondary | ICD-10-CM

## 2024-01-17 MED ORDER — LEVOTHYROXINE SODIUM 112 MCG PO TABS: 112.0000 ug | ORAL_TABLET | Freq: Every day | ORAL | 1 refills | Status: AC

## 2024-01-17 MED ORDER — ATORVASTATIN CALCIUM 10 MG PO TABS: 10.0000 mg | ORAL_TABLET | Freq: Every day | ORAL | 1 refills | Status: AC

## 2024-01-17 MED ORDER — MONTELUKAST SODIUM 10 MG PO TABS: ORAL_TABLET | ORAL | 1 refills | Status: AC

## 2024-04-20 ENCOUNTER — Encounter: Payer: Self-pay | Admitting: Nurse Practitioner
